# Patient Record
Sex: Female | Born: 1960 | Race: White | Hispanic: No | Marital: Married | Smoking: Current every day smoker
Health system: Southern US, Community
[De-identification: ages and names within clinical notes are randomized; demographics above are authoritative.]

## PROBLEM LIST (undated history)

## (undated) DIAGNOSIS — D519 Vitamin B12 deficiency anemia, unspecified: Secondary | ICD-10-CM

## (undated) DIAGNOSIS — E785 Hyperlipidemia, unspecified: Secondary | ICD-10-CM

## (undated) DIAGNOSIS — G459 Transient cerebral ischemic attack, unspecified: Secondary | ICD-10-CM

## (undated) DIAGNOSIS — I10 Essential (primary) hypertension: Secondary | ICD-10-CM

## (undated) DIAGNOSIS — E119 Type 2 diabetes mellitus without complications: Secondary | ICD-10-CM

## (undated) DIAGNOSIS — D509 Iron deficiency anemia, unspecified: Secondary | ICD-10-CM

## (undated) HISTORY — DX: Essential (primary) hypertension: I10

## (undated) HISTORY — PX: ORIF ELBOW FRACTURE: SUR928

## (undated) HISTORY — PX: FRACTURE SURGERY: SHX138

## (undated) HISTORY — DX: Hyperlipidemia, unspecified: E78.5

---

## 1993-12-29 HISTORY — PX: TUBAL LIGATION: SHX77

## 1996-12-29 HISTORY — PX: CARDIAC CATHETERIZATION: SHX172

## 2008-08-30 ENCOUNTER — Ambulatory Visit: Payer: Self-pay | Admitting: Cardiology

## 2008-08-30 ENCOUNTER — Encounter: Payer: Self-pay | Admitting: Cardiovascular Disease

## 2008-08-30 ENCOUNTER — Inpatient Hospital Stay (HOSPITAL_COMMUNITY): Admission: EM | Admit: 2008-08-30 | Discharge: 2008-08-31 | Payer: Self-pay | Admitting: Emergency Medicine

## 2008-08-31 ENCOUNTER — Ambulatory Visit: Payer: Self-pay | Admitting: Vascular Surgery

## 2008-08-31 ENCOUNTER — Encounter: Payer: Self-pay | Admitting: Cardiovascular Disease

## 2008-09-06 ENCOUNTER — Ambulatory Visit: Payer: Self-pay | Admitting: Internal Medicine

## 2008-09-06 DIAGNOSIS — R5381 Other malaise: Secondary | ICD-10-CM

## 2008-09-06 DIAGNOSIS — E1165 Type 2 diabetes mellitus with hyperglycemia: Secondary | ICD-10-CM

## 2008-09-06 DIAGNOSIS — E785 Hyperlipidemia, unspecified: Secondary | ICD-10-CM | POA: Insufficient documentation

## 2008-09-06 DIAGNOSIS — R5383 Other fatigue: Secondary | ICD-10-CM

## 2008-09-06 DIAGNOSIS — R030 Elevated blood-pressure reading, without diagnosis of hypertension: Secondary | ICD-10-CM

## 2008-09-06 DIAGNOSIS — D500 Iron deficiency anemia secondary to blood loss (chronic): Secondary | ICD-10-CM

## 2008-09-06 DIAGNOSIS — D509 Iron deficiency anemia, unspecified: Secondary | ICD-10-CM | POA: Insufficient documentation

## 2008-09-06 DIAGNOSIS — F17200 Nicotine dependence, unspecified, uncomplicated: Secondary | ICD-10-CM | POA: Insufficient documentation

## 2008-09-06 DIAGNOSIS — N920 Excessive and frequent menstruation with regular cycle: Secondary | ICD-10-CM

## 2008-09-06 DIAGNOSIS — E118 Type 2 diabetes mellitus with unspecified complications: Secondary | ICD-10-CM

## 2008-09-06 LAB — CONVERTED CEMR LAB
Basophils Absolute: 0.1 10*3/uL (ref 0.0–0.1)
Basophils Relative: 0.8 % (ref 0.0–3.0)
Eosinophils Absolute: 0.2 10*3/uL (ref 0.0–0.7)
HCT: 28.4 % — ABNORMAL LOW (ref 36.0–46.0)
Hemoglobin: 9 g/dL — ABNORMAL LOW (ref 12.0–15.0)
Lymphocytes Relative: 29.2 % (ref 12.0–46.0)
MCHC: 31.5 g/dL (ref 30.0–36.0)
MCV: 69.3 fL — ABNORMAL LOW (ref 78.0–100.0)
Monocytes Absolute: 0.3 10*3/uL (ref 0.1–1.0)
Neutro Abs: 5.3 10*3/uL (ref 1.4–7.7)
RBC: 4.1 M/uL (ref 3.87–5.11)
RDW: 16.5 % — ABNORMAL HIGH (ref 11.5–14.6)
Vitamin B-12: 164 pg/mL — ABNORMAL LOW (ref 211–911)

## 2008-09-07 ENCOUNTER — Telehealth: Payer: Self-pay | Admitting: Internal Medicine

## 2008-09-07 ENCOUNTER — Encounter: Payer: Self-pay | Admitting: Internal Medicine

## 2008-09-13 ENCOUNTER — Ambulatory Visit: Payer: Self-pay | Admitting: Internal Medicine

## 2008-09-13 ENCOUNTER — Telehealth: Payer: Self-pay | Admitting: Internal Medicine

## 2008-09-20 ENCOUNTER — Ambulatory Visit: Payer: Self-pay | Admitting: Internal Medicine

## 2008-09-20 DIAGNOSIS — D518 Other vitamin B12 deficiency anemias: Secondary | ICD-10-CM

## 2008-09-29 ENCOUNTER — Ambulatory Visit: Payer: Self-pay | Admitting: Internal Medicine

## 2008-10-10 ENCOUNTER — Ambulatory Visit: Payer: Self-pay | Admitting: Internal Medicine

## 2008-11-03 ENCOUNTER — Ambulatory Visit: Payer: Self-pay | Admitting: Internal Medicine

## 2008-11-22 ENCOUNTER — Ambulatory Visit: Payer: Self-pay | Admitting: Internal Medicine

## 2008-12-21 ENCOUNTER — Ambulatory Visit: Payer: Self-pay | Admitting: Internal Medicine

## 2009-02-20 ENCOUNTER — Ambulatory Visit: Payer: Self-pay | Admitting: Internal Medicine

## 2009-03-05 ENCOUNTER — Telehealth: Payer: Self-pay | Admitting: Internal Medicine

## 2009-04-13 ENCOUNTER — Telehealth: Payer: Self-pay | Admitting: Internal Medicine

## 2009-04-13 ENCOUNTER — Ambulatory Visit: Payer: Self-pay | Admitting: Internal Medicine

## 2010-01-22 ENCOUNTER — Telehealth: Payer: Self-pay | Admitting: Internal Medicine

## 2011-01-30 NOTE — Progress Notes (Signed)
Summary: refill  Phone Note Refill Request Message from:  Fax from Pharmacy  Refills Requested: Medication #1:  COBAL-1000 1000 MCG/ML SOLN one mL subcutaneous q.2 weeks Initial call taken by: Rock Nephew CMA,  January 22, 2010 1:50 PM    Prescriptions: COBAL-1000 1000 MCG/ML SOLN (CYANOCOBALAMIN) one mL subcutaneous q.2 weeks  #10 mL x 5   Entered by:   Rock Nephew CMA   Authorized by:   Etta Grandchild MD   Signed by:   Rock Nephew CMA on 01/22/2010   Method used:   Electronically to        [facility] [address] [phone]* (retail)       [address]       [place], [place]  [address]       Ph: [phone]       Fax: [phone]   RxID:   [id number] COBAL-1000 1000 MCG/ML SOLN (CYANOCOBALAMIN) one mL subcutaneous q.2 weeks  #10 mL x 5   Entered by:   Rock Nephew CMA   Authorized by:   Etta Grandchild MD   Signed by:   Rock Nephew CMA on 01/22/2010   Method used:   Electronically to        [facility] [address] [phone]* (retail)       [address]       [place], [place]  [address]       Ph: [phone] or [phone]       Fax: [phone]   RxID:   [id number]

## 2011-05-13 NOTE — H&P (Signed)
NAMEPRAIRIE, STENBERG                ACCOUNT NO.:  [id number]   MEDICAL RECORD NO.:  [id number]          PATIENT TYPE:  INP   LOCATION:  2623                         FACILITY:  [facility]   PHYSICIAN:  Veverly Fells. Excell Seltzer, MD  DATE OF BIRTH:  09-27-1961   DATE OF ADMISSION:  08/30/2008  DATE OF DISCHARGE:                              HISTORY & PHYSICAL   PRIMARY CARE PHYSICIAN:  None.   CARDIOLOGIST:  None.   CHIEF COMPLAINT:  Chest pain.   HISTORY OF PRESENT ILLNESS:  Ms. Gilkey is a 50 year old female with no  previous history of coronary artery disease.  In 1998, she had chest  pain and came to the emergency room where her EKG was abnormal.  She was  cathed and she reports the cath was okay.   She has had what she describes as a traveling pain.  She states she will  get pain in her left lower extremity that it will move over to her right  lower extremity, into her abdomen, and up into her chest.  She states  that the pain will be sharp and when she tries to go from a lying to a  sitting position, sometimes the pain is severe and radiates up to the  back of her head such that she has trouble sitting up.   Ms. Salah has had some sharp chest pain intermittently over the last  couple of days.  Today, she states she woke up about 7 a.m. and noted a  chest tightness.  It is under her left breast.  It reaches a 6/10.  She  has also had nausea for the last several days, but feels that the nausea  was worse today.  She denies diaphoresis or shortness of breath at rest,  although she has had increased dyspnea on exertion recently.  She went  to [facility] on [address] where her EKG was abnormal.  She was  transferred by EMS to [facility].  Initially, her EKG was  abnormal enough such that a code STEMI was called; however, upon further  review, her EKG is relatively unchanged from the one in 1998, so the  code STEMI is deferred for now and medical therapy is recommended.  She  has  been started on heparin and will receive additional sublingual  nitroglycerin.  She had baby aspirin x4 by EMS and sublingual  nitroglycerin, which did improve her symptoms.  Currently, she appears a  little anxious, but only in mild distress.   PAST MEDICAL HISTORY:  1. Status post cardiac catheterization in 1998, which was okay per the      patient.  2. Obesity.  3. She has had no checkup in 10 years, but has never been told that      she is hypertensive, has diabetes, or has high cholesterol.  4. Ongoing tobacco use.   SURGERIES:  C-section x3.   ALLERGIES:  SULFA.   CURRENT MEDICATIONS:  None.   SOCIAL HISTORY:  She lives in [place] with her husband and works as a  Naval architect.  She has a greater  than 30-pack-year history of ongoing  tobacco use.  She has approximately 2 drinks a day on the weekend, but  her husband states she does not drink during the week.  She denies drug  use.   FAMILY HISTORY:  Her mother is alive in her 57s and her father died in  his 24s.  Neither one had heart disease.  She has one sister with no  known history of CAD.   REVIEW OF SYSTEMS:  She has not had fevers, chills, or sweats.  She has  an occasional cough and rare wheezing.  She denies syncope or  presyncope.  She has had the chest pain, which is described above and  upon arrival to the emergency room, it was a 4/10.  She has chronic  arthralgias and especially lower extremity pain.  She denies reflux  symptoms, hematemesis, hemoptysis, or melena.  Full 14-point review of  systems is otherwise negative.   PHYSICAL EXAM:  VITAL SIGNS:  Her temperature is 98.3, blood pressure  153/92, pulse 66, respiratory rate 20, and O2 saturation 99% on room  air.  GENERAL:  She is a well-developed, well-nourished, white female in mild-  to-moderate distress.  HEENT:  Normal.  NECK:  There is no lymphadenopathy, thyromegaly, bruit, or JVD noted.  CV:  Her heart is regular in rate and rhythm with an S1  and S2 and no  significant murmur, rub, or gallop is noted.  LUNGS:  Essentially clear to auscultation bilaterally.  SKIN:  No rashes or lesions are noted.  ABDOMEN:  Soft and nontender with active bowel sounds.  No splenomegaly  is noted.  EXTREMITIES:  Distal pulses are 2 to 3 + in all four extremities and no  femoral bruits are appreciated.  There is no cyanosis, clubbing, or  edema noted.  MUSCULOSKELETAL:  There is no joint deformity or effusions and no spine  or CVA tenderness.  NEURO:  She is alert and oriented.  Cranial nerves II through XII are  grossly intact.   Chest x-ray:  No acute disease.   EKG:  Sinus rhythm, rate 64 with occasional PVCs.  She has diffuse  inferior ST elevation of 1 mm or less, which is unchanged from an EKG  dated December 1998.   LABORATORY VALUES:  Hemoglobin 9.3, hematocrit 30.5, WBC 5.3, platelets  191.  INR 1.1, PTT 26.  I-STAT shows a sodium of 140, potassium of 3.8,  chloride 107, BUN 9, creatinine 0.9, glucose 97.  Initial point of care  markers are negative.   IMPRESSION:  Ms. Moritz was seen today by Dr. Excell Seltzer.  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  She also has intermittent chest pain over  the last 2 days.  Her EKG shows 1-mm of inferior ST elevation without  reciprocal changes.  This EKG is unchanged from a previous tracing dated  1998.  Her exam is benign.  Dr. Excell Seltzer does not suspect acute myocardial  infarction.  We will check venous duplex to rule out deep venous  thrombosis and CT angiogram of the chest to rule out pulmonary embolism.  We will check a stat echocardiogram to assess her inferior  wall.  She will be treated with aspirin, heparin, nitroglycerin and a  beta-blocker as well as an empiric statin.  Lipid profile, TSH, and  other labs are pending at the time of dictation.  Further evaluation and  treatment will depend on the results of the above testing with the  decision on cardiac catheterization made once  the above data are  reviewed.      Theodore Demark, PA-C      Veverly Fells. Excell Seltzer, MD  Electronically Signed    RB/MEDQ  D:  08/30/2008  T:  08/31/2008  Job:  [id number]

## 2011-05-13 NOTE — Discharge Summary (Signed)
NAMESANIYA, TRANCHINA                ACCOUNT NO.:  [id number]   MEDICAL RECORD NO.:  [id number]          PATIENT TYPE:  INP   LOCATION:  2623                         FACILITY:  [facility]   PHYSICIAN:  Veverly Fells. Excell Seltzer, MD  DATE OF BIRTH:  19-Feb-1961   DATE OF ADMISSION:  08/30/2008  DATE OF DISCHARGE:  08/31/2008                               DISCHARGE SUMMARY   PRIMARY CARDIOLOGIST:  Veverly Fells. Excell Seltzer, MD.   PRIMARY CARE PHYSICIAN:  The patient does not have one but will be  established with Dr. Sanda Linger.   PROCEDURES PERFORMED DURING HOSPITALIZATION:  1. CT scan of chest.  2. [place] extremity Doppler studies to rule out DVT, both of which were      negative.   FINAL DISCHARGE DIAGNOSES:  1. Left calf pain.      a.     Status post lower extremity Doppler study, negative for deep       vein thrombosis.  2. Chest pain.      a.     Status post CT scan of the chest, pulmonary embolism ruled       out with negative cardiac enzymes.  3. Hypercholesterolemia.  4. Vasovagal response to blood draws causing bradycardia and fainting.  5. Anemia.      a.     History of heavy menses and microcytic anemia.   HOSPITAL COURSE:  This is a 50 year old obese Caucasian female with no  prior history of coronary artery disease who had a chest pain and came  to the emergency room and pain in her lower extremities.  She described  it as traveling pain.  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  The pain is described as sharp over the last couple  of days, and she states that she awoke with chest tightness around 7:00  a.m. the day of admission, located under her left breast which was at an  intensity of 6/10.  The patient also had some mild nausea.  As a result  of this, the patient came to [facility] Emergency Room where she was  seen and examined by Dr. Tonny Bollman to evaluate for coronary artery  disease  and rule out myocardial infarction.   The patient was admitted, placed on a nitroglycerin drip and heparin  drip, and cardiac enzymes were cycled.  There were found to be negative  x3.  Echocardiogram was also completed revealing LVEF of 60%.  The  patient's chest CT to evaluate for PE was negative, and the patient had  followup lower extremity Doppler studies which were negative.   It was noted that the patient was anemic during hospitalization with a  hemoglobin of 8.4 and hematocrit of 26.7.  The patient's white blood  cells were 6.9 with platelets of 168.  The patient has a history of  microcytic anemia and has not been followed by primary care or OB/GYN in  approximately 12 years.  The patient has a history of heavy menses and  anemia that has not had any follow up concerning this.  As a result of  this, the patient will be established with primary care and reestablish  with her OB/GYN physician prior to discharge.   On day of discharge, the patient was seen and examined by myself and Dr.  Tonny Bollman and was found to be stable.  All testing back negative.  We believe this may be more musculoskeletal pain in origin.  However,  the patient will need to be to established with primary care physician.  We have made arrangements for the patient to follow up with Dr. Shelah Lewandowsky with [facility] and reestablish with Dr. Richarda Overlie for OB/GYN physician.   DISCHARGE LABORATORY DATA:  Sodium 141, potassium 3.6, chloride 108, CO2  of 25, BUN 7, creatinine 1.0, and glucose 109.  Hemoglobin 8.4,  hematocrit 26.7, white blood cells 6.9, and platelets 168.  Total  cholesterol 212, triglycerides 293, HDL 36, and LDL 117.  Hemoglobin A1c  has been drawn but results are pending.   DISCHARGE MEDICATIONS:  Crestor 40 mg p.o. at bedtime.   ALLERGIES:  SULFA.   FOLLOWUP PLANS AND APPOINTMENTS:  1. The patient will be established with Dr. Sanda Linger with [facility]       Primary Care on September 06, 2008, at 2:45 p.m.  2. The patient will be reestablished with OB/GYN physician, Dr.      Richarda Overlie with an appointment scheduled for September 13, 2008, at 11:00 a.m.  3. The patient has been given prescription for Crestor to take at home      at 40 mg at bedtime.   Time spent with the patient to include physician time 35 minutes.      Bettey Mare. Lyman Bishop, NP      Veverly Fells. Excell Seltzer, MD  Electronically Signed    KML/MEDQ  D:  08/31/2008  T:  09/01/2008  Job:  [id number]   cc:   Duke Salvia. Marcelle Overlie, M.D.  Sanda Linger, MD

## 2011-08-20 LAB — HM PAP SMEAR: HM Pap smear: NORMAL

## 2011-10-01 LAB — COMPREHENSIVE METABOLIC PANEL
ALT: 14
AST: 18
Alkaline Phosphatase: 53
CO2: 23
Calcium: 8.4
GFR calc Af Amer: >60
Glucose, Bld: 104 — ABNORMAL HIGH
Potassium: 3.7
Sodium: 139
Total Protein: 6.3

## 2011-10-01 LAB — RAPID URINE DRUG SCREEN, HOSP PERFORMED: Tetrahydrocannabinol: NOT DETECTED

## 2011-10-01 LAB — CBC
HCT: 30.5 — ABNORMAL LOW
Hemoglobin: 8.4 — ABNORMAL LOW
Hemoglobin: 9.3 — ABNORMAL LOW
MCHC: 30.6
MCV: 69 — ABNORMAL LOW
MCV: 69.7 — ABNORMAL LOW
RBC: 3.87
RBC: 4.38
WBC: 5.3
WBC: 6.9

## 2011-10-01 LAB — CK TOTAL AND CKMB (NOT AT ARMC)
CK, MB: 1.2
Relative Index: INVALID
Total CK: 63

## 2011-10-01 LAB — POCT I-STAT, CHEM 8
HCT: 31 — ABNORMAL LOW
Hemoglobin: 10.5 — ABNORMAL LOW
Potassium: 3.8
Sodium: 140
TCO2: 24

## 2011-10-01 LAB — HEPARIN LEVEL (UNFRACTIONATED): Heparin Unfractionated: 0.48

## 2011-10-01 LAB — BASIC METABOLIC PANEL
BUN: 7
CO2: 26
Chloride: 108
Creatinine, Ser: 1.01
GFR calc Af Amer: >60
Glucose, Bld: 109 — ABNORMAL HIGH

## 2011-10-01 LAB — CROSSMATCH: Antibody Screen: NEGATIVE

## 2011-10-01 LAB — DIFFERENTIAL
Basophils Relative: 1
Eosinophils Absolute: 0.2
Eosinophils Relative: 3
Monocytes Absolute: 0.3
Neutro Abs: 3.1
Neutrophils Relative %: 60

## 2011-10-01 LAB — HEPARIN ANTIBODY SCREEN: Heparin Antibody Screen: POSITIVE

## 2011-10-01 LAB — LIPID PANEL
HDL: 36 — ABNORMAL LOW
Triglycerides: 293 — ABNORMAL HIGH
VLDL: 59 — ABNORMAL HIGH

## 2011-10-01 LAB — POCT CARDIAC MARKERS
CKMB, poc: 1.1
Myoglobin, poc: 40.8
Troponin i, poc: <0.05

## 2011-10-01 LAB — PROTIME-INR: Prothrombin Time: 14.2

## 2011-10-01 LAB — CARDIAC PANEL(CRET KIN+CKTOT+MB+TROPI)
CK, MB: 1
Relative Index: INVALID
Total CK: 62

## 2011-10-01 LAB — TROPONIN I: Troponin I: <0.01

## 2011-10-01 LAB — HEPARIN INDUCED THROMBOCYTOPENIA PNL: Patient O.D.: 0.242

## 2012-01-08 ENCOUNTER — Other Ambulatory Visit: Payer: Self-pay | Admitting: Internal Medicine

## 2012-01-20 ENCOUNTER — Other Ambulatory Visit: Payer: Self-pay

## 2012-01-20 NOTE — Telephone Encounter (Signed)
Patient requesting refill on Vitamin B12 for her injections. [facility] is [facility]. Please call the patient when done at [phone]

## 2012-01-21 NOTE — Telephone Encounter (Signed)
Denied, pharmacy notified. Patient no longer under MD care, has not been seen since 2010

## 2012-06-11 ENCOUNTER — Ambulatory Visit (INDEPENDENT_AMBULATORY_CARE_PROVIDER_SITE_OTHER): Payer: Managed Care, Other (non HMO) | Admitting: Internal Medicine

## 2012-06-11 ENCOUNTER — Other Ambulatory Visit (INDEPENDENT_AMBULATORY_CARE_PROVIDER_SITE_OTHER): Payer: Managed Care, Other (non HMO)

## 2012-06-11 ENCOUNTER — Telehealth: Payer: Self-pay | Admitting: *Deleted

## 2012-06-11 ENCOUNTER — Encounter: Payer: Self-pay | Admitting: Internal Medicine

## 2012-06-11 VITALS — BP 168/100 | HR 72 | Temp 98.4°F | Resp 16 | Wt 185.8 lb

## 2012-06-11 DIAGNOSIS — E785 Hyperlipidemia, unspecified: Secondary | ICD-10-CM

## 2012-06-11 DIAGNOSIS — D529 Folate deficiency anemia, unspecified: Secondary | ICD-10-CM

## 2012-06-11 DIAGNOSIS — D518 Other vitamin B12 deficiency anemias: Secondary | ICD-10-CM

## 2012-06-11 DIAGNOSIS — Z1231 Encounter for screening mammogram for malignant neoplasm of breast: Secondary | ICD-10-CM | POA: Insufficient documentation

## 2012-06-11 DIAGNOSIS — M5416 Radiculopathy, lumbar region: Secondary | ICD-10-CM | POA: Insufficient documentation

## 2012-06-11 DIAGNOSIS — I10 Essential (primary) hypertension: Secondary | ICD-10-CM | POA: Insufficient documentation

## 2012-06-11 DIAGNOSIS — D509 Iron deficiency anemia, unspecified: Secondary | ICD-10-CM

## 2012-06-11 DIAGNOSIS — E119 Type 2 diabetes mellitus without complications: Secondary | ICD-10-CM

## 2012-06-11 DIAGNOSIS — IMO0002 Reserved for concepts with insufficient information to code with codable children: Secondary | ICD-10-CM

## 2012-06-11 DIAGNOSIS — D500 Iron deficiency anemia secondary to blood loss (chronic): Secondary | ICD-10-CM

## 2012-06-11 LAB — URINALYSIS, ROUTINE W REFLEX MICROSCOPIC
Bilirubin Urine: NEGATIVE
Hgb urine dipstick: NEGATIVE
Ketones, ur: NEGATIVE
Urobilinogen, UA: 0.2 (ref 0.0–1.0)

## 2012-06-11 LAB — LIPID PANEL
HDL: 43.6 mg/dL (ref >39.00)
LDL Cholesterol: 121 mg/dL — ABNORMAL HIGH (ref 0–99)
Total CHOL/HDL Ratio: 4
Triglycerides: 159 mg/dL — ABNORMAL HIGH (ref 0.0–149.0)

## 2012-06-11 LAB — CBC WITH DIFFERENTIAL/PLATELET
Eosinophils Relative: 2.7 % (ref 0.0–5.0)
Lymphocytes Relative: 33.8 % (ref 12.0–46.0)
MCV: 59.8 fl — ABNORMAL LOW (ref 78.0–100.0)
Monocytes Absolute: 0.4 10*3/uL (ref 0.1–1.0)
Neutrophils Relative %: 55.9 % (ref 43.0–77.0)
Platelets: 230 10*3/uL (ref 150.0–400.0)
RBC: 3.91 Mil/uL (ref 3.87–5.11)
WBC: 6.9 10*3/uL (ref 4.5–10.5)

## 2012-06-11 LAB — COMPREHENSIVE METABOLIC PANEL
AST: 20 U/L (ref 0–37)
Albumin: 3.8 g/dL (ref 3.5–5.2)
Alkaline Phosphatase: 59 U/L (ref 39–117)
Calcium: 8.6 mg/dL (ref 8.4–10.5)
Chloride: 105 mEq/L (ref 96–112)
Glucose, Bld: 112 mg/dL — ABNORMAL HIGH (ref 70–99)
Potassium: 4.1 mEq/L (ref 3.5–5.1)
Sodium: 138 mEq/L (ref 135–145)
Total Protein: 7.2 g/dL (ref 6.0–8.3)

## 2012-06-11 LAB — VITAMIN B12: Vitamin B-12: 839 pg/mL (ref 211–911)

## 2012-06-11 LAB — FERRITIN: Ferritin: 1.5 ng/mL — ABNORMAL LOW (ref 10.0–291.0)

## 2012-06-11 MED ORDER — "SYRINGE/NEEDLE (DISP) 25G X 5/8"" 3 ML MISC": 1.0000 | Status: DC

## 2012-06-11 MED ORDER — AMLODIPINE-OLMESARTAN 10-40 MG PO TABS: 1.0000 | ORAL_TABLET | Freq: Every day | ORAL | Status: DC

## 2012-06-11 MED ORDER — CYANOCOBALAMIN 1000 MCG/ML IJ SOLN: 1000.0000 ug | INTRAMUSCULAR | Status: DC

## 2012-06-11 NOTE — Telephone Encounter (Signed)
CRITICAL LAB VALUE CALLED FROM [facility] IN [facility].  PATIENT HGB 6.8; HCT 23.4

## 2012-06-11 NOTE — Telephone Encounter (Signed)
CRITICAL LAB VALUE SENT TO DR. Leitha Schuller

## 2012-06-11 NOTE — Patient Instructions (Signed)
Hypertension As your heart beats, it forces blood through your arteries. This force is your blood pressure. If the pressure is too high, it is called hypertension (HTN) or high blood pressure. HTN is dangerous because you may have it and not know it. High blood pressure may mean that your heart has to work harder to pump blood. Your arteries may be narrow or stiff. The extra work puts you at risk for heart disease, stroke, and other problems.  Blood pressure consists of two numbers, a higher number over a lower, 110/72, for example. It is stated as "110 over 72." The ideal is below 120 for the top number (systolic) and under 80 for the bottom (diastolic). Write down your blood pressure today. You should pay close attention to your blood pressure if you have certain conditions such as:  Heart failure.   Prior heart attack.   Diabetes   Chronic kidney disease.   Prior stroke.   Multiple risk factors for heart disease.  To see if you have HTN, your blood pressure should be measured while you are seated with your arm held at the level of the heart. It should be measured at least twice. A one-time elevated blood pressure reading (especially in the Emergency Department) does not mean that you need treatment. There may be conditions in which the blood pressure is different between your right and left arms. It is important to see your caregiver soon for a recheck. Most people have essential hypertension which means that there is not a specific cause. This type of high blood pressure may be lowered by changing lifestyle factors such as:  Stress.   Smoking.   Lack of exercise.   Excessive weight.   Drug/tobacco/alcohol use.   Eating less salt.  Most people do not have symptoms from high blood pressure until it has caused damage to the body. Effective treatment can often prevent, delay or reduce that damage. TREATMENT  When a cause has been identified, treatment for high blood pressure is  directed at the cause. There are a large number of medications to treat HTN. These fall into several categories, and your caregiver will help you select the medicines that are best for you. Medications may have side effects. You should review side effects with your caregiver. If your blood pressure stays high after you have made lifestyle changes or started on medicines,   Your medication(s) may need to be changed.   Other problems may need to be addressed.   Be certain you understand your prescriptions, and know how and when to take your medicine.   Be sure to follow up with your caregiver within the time frame advised (usually within two weeks) to have your blood pressure rechecked and to review your medications.   If you are taking more than one medicine to lower your blood pressure, make sure you know how and at what times they should be taken. Taking two medicines at the same time can result in blood pressure that is too low.  SEEK IMMEDIATE MEDICAL CARE IF:  You develop a severe headache, blurred or changing vision, or confusion.   You have unusual weakness or numbness, or a faint feeling.   You have severe chest or abdominal pain, vomiting, or breathing problems.  MAKE SURE YOU:   Understand these instructions.   Will watch your condition.   Will get help right away if you are not doing well or get worse.  Document Released: 12/15/2005 Document Revised: 12/04/2011 Document Reviewed:   08/04/2008 Exit[employer] Patient Information 2012 [employer].Back Pain, Adult Low back pain is very common. About 1 in 5 people have back pain.The cause of low back pain is rarely dangerous. The pain often gets better over time.About half of people with a sudden onset of back pain feel better in just 2 weeks. About 8 in 10 people feel better by 6 weeks.  CAUSES Some common causes of back pain include:  Strain of the muscles or ligaments supporting the spine.   Wear and tear (degeneration) of the  spinal discs.   Arthritis.   Direct injury to the back.  DIAGNOSIS Most of the time, the direct cause of low back pain is not known.However, back pain can be treated effectively even when the exact cause of the pain is unknown.Answering your caregiver's questions about your overall health and symptoms is one of the most accurate ways to make sure the cause of your pain is not dangerous. If your caregiver needs more information, he or she may order lab work or imaging tests (X-rays or MRIs).However, even if imaging tests show changes in your back, this usually does not require surgery. HOME CARE INSTRUCTIONS For many people, back pain returns.Since low back pain is rarely dangerous, it is often a condition that people can learn to manageon their own.   Remain active. It is stressful on the back to sit or stand in one place. Do not sit, drive, or stand in one place for more than 30 minutes at a time. Take short walks on level surfaces as soon as pain allows.Try to increase the length of time you walk each day.   Do not stay in bed.Resting more than 1 or 2 days can delay your recovery.   Do not avoid exercise or work.Your body is made to move.It is not dangerous to be active, even though your back may hurt.Your back will likely heal faster if you return to being active before your pain is gone.   Pay attention to your body when you bend and lift. Many people have less discomfortwhen lifting if they bend their knees, keep the load close to their bodies,and avoid twisting. Often, the most comfortable positions are those that put less stress on your recovering back.   Find a comfortable position to sleep. Use a firm mattress and lie on your side with your knees slightly bent. If you lie on your back, put a pillow under your knees.   Only take over-the-counter or prescription medicines as directed by your caregiver. Over-the-counter medicines to reduce pain and inflammation are often the  most helpful.Your caregiver may prescribe muscle relaxant drugs.These medicines help dull your pain so you can more quickly return to your normal activities and healthy exercise.   Put ice on the injured area.   Put ice in a plastic bag.   Place a towel between your skin and the bag.   Leave the ice on for 15 to 20 minutes, 3 to 4 times a day for the first 2 to 3 days. After that, ice and heat may be alternated to reduce pain and spasms.   Ask your caregiver about trying back exercises and gentle massage. This may be of some benefit.   Avoid feeling anxious or stressed.Stress increases muscle tension and can worsen back pain.It is important to recognize when you are anxious or stressed and learn ways to manage it.Exercise is a great option.  SEEK MEDICAL CARE IF:  You have pain that is not relieved with   rest or medicine.   You have pain that does not improve in 1 week.   You have new symptoms.   You are generally not feeling well.  SEEK IMMEDIATE MEDICAL CARE IF:   You have pain that radiates from your back into your legs.   You develop new bowel or bladder control problems.   You have unusual weakness or numbness in your arms or legs.   You develop nausea or vomiting.   You develop abdominal pain.   You feel faint.  Document Released: 12/15/2005 Document Revised: 12/04/2011 Document Reviewed: 05/05/2011 [employer] Patient Information 2012 [employer]. 

## 2012-06-11 NOTE — Progress Notes (Signed)
Subjective:    Patient ID: Madeline West, female    DOB: [birth year], 51 y.o.   MRN: [id number]  Back Pain This is a chronic problem. Episode onset: 3 months ago. The problem has been gradually worsening since onset. The pain is present in the lumbar spine. The quality of the pain is described as shooting and stabbing. The pain radiates to the right thigh. The pain is at a severity of 6/10. The pain is moderate. The pain is worse during the day. The symptoms are aggravated by twisting. Stiffness is present all day. Associated symptoms include leg pain, numbness (right leg) and tingling (right leg). Pertinent negatives include no abdominal pain, bladder incontinence, bowel incontinence, chest pain, dysuria, fever, headaches, paresis, paresthesias, pelvic pain, perianal numbness, weakness or weight loss. Risk factors include obesity and lack of exercise. She has tried NSAIDs for the symptoms. The treatment provided moderate relief.  Anemia Presents for follow-up visit. Symptoms include light-headedness, malaise/fatigue and pallor. There has been no abdominal pain, anorexia, bruising/bleeding easily, confusion, fever, leg swelling, palpitations, paresthesias, pica or weight loss. Signs of blood loss that are present include menorrhagia. Signs of blood loss that are not present include hematemesis, hematochezia, melena and vaginal bleeding. Compliance problems include psychosocial issues.  Compliance with medications is 0-25%.      Review of Systems  Constitutional: Positive for malaise/fatigue and fatigue. Negative for fever, chills, weight loss, diaphoresis, activity change, appetite change and unexpected weight change.  HENT: Negative.   Eyes: Negative.   Respiratory: Negative for apnea, cough, choking, shortness of breath, wheezing and stridor.   Cardiovascular: Negative for chest pain, palpitations and leg swelling.  Gastrointestinal: Negative for nausea, vomiting, abdominal pain, diarrhea,  constipation, blood in stool, melena, hematochezia, abdominal distention, anal bleeding, rectal pain, anorexia, hematemesis and bowel incontinence.  Genitourinary: Positive for menorrhagia. Negative for bladder incontinence, dysuria, urgency, frequency, hematuria, flank pain, decreased urine volume, vaginal bleeding, vaginal discharge, enuresis, difficulty urinating, genital sores, vaginal pain, menstrual problem, pelvic pain and dyspareunia.  Musculoskeletal: Positive for back pain. Negative for myalgias, joint swelling, arthralgias and gait problem.  Skin: Positive for pallor. Negative for color change, rash and wound.  Neurological: Positive for tingling (right leg), light-headedness and numbness (right leg). Negative for dizziness, tremors, seizures, syncope, facial asymmetry, speech difficulty, weakness, headaches and paresthesias.  Hematological: Negative for adenopathy. Does not bruise/bleed easily.  Psychiatric/Behavioral: Negative.  Negative for confusion.       Objective:   Physical Exam  Vitals reviewed. Constitutional: She is oriented to person, place, and time. She appears well-developed and well-nourished. No distress.  HENT:  Head: Normocephalic and atraumatic. No trismus in the jaw.  Mouth/Throat: Oropharynx is clear and moist. Mucous membranes are pale, not dry and not cyanotic. No uvula swelling. No oropharyngeal exudate.  Eyes: Conjunctivae are normal. Right eye exhibits no discharge. Left eye exhibits no discharge. No scleral icterus.  Neck: Normal range of motion. Neck supple. No JVD present. No tracheal deviation present. No thyromegaly present.  Cardiovascular: Normal rate, regular rhythm, normal heart sounds and intact distal pulses.  Exam reveals no gallop and no friction rub.   No murmur heard. Pulmonary/Chest: Effort normal and breath sounds normal. No stridor. No respiratory distress. She has no wheezes. She has no rales. She exhibits no tenderness.  Abdominal: Soft.  Bowel sounds are normal. She exhibits no distension and no mass. There is no tenderness. There is no rebound and no guarding.  Musculoskeletal: Normal range of motion. She exhibits no  edema and no tenderness.       Lumbar back: Normal. She exhibits normal range of motion, no tenderness, no bony tenderness, no swelling, no edema, no deformity, no laceration, no pain and no spasm.       + SLR in the RLE - SLR in the LLE  Lymphadenopathy:    She has no cervical adenopathy.  Neurological: She is alert and oriented to person, place, and time. She has normal strength. She displays abnormal reflex. She displays no atrophy and no tremor. No cranial nerve deficit or sensory deficit. She exhibits normal muscle tone. She displays a negative Romberg sign. She displays no seizure activity. Coordination and gait normal. She displays no Babinski's sign on the right side. She displays no Babinski's sign on the left side.  Reflex Scores:      Tricep reflexes are 1+ on the right side and 1+ on the left side.      Bicep reflexes are 1+ on the right side and 1+ on the left side.      Brachioradialis reflexes are 1+ on the right side.      Patellar reflexes are 0 on the right side.      Achilles reflexes are 0 on the right side and 1+ on the left side. Skin: Skin is warm and dry. No rash noted. She is not diaphoretic. No erythema. No pallor.  Psychiatric: She has a normal mood and affect. Her behavior is normal. Judgment and thought content normal.     Lab Results  Component Value Date   WBC 8.3 09/06/2008   HGB 9.0* 09/06/2008   HCT 28.4* 09/06/2008   PLT 245 09/06/2008   GLUCOSE 109* 08/31/2008   CHOL  Value: 212        ATP III CLASSIFICATION:  <200     mg/dL   Desirable  914-782  mg/dL   Borderline High  >=956    mg/dL   High* 01/29/3085   TRIG 293* 08/30/2008   HDL 36* 08/30/2008   LDLCALC  Value: 117        Total Cholesterol/HDL:CHD Risk Coronary Heart Disease Risk Table                     Men   Women  1/2 Average  Risk   3.4   3.3* 08/30/2008   ALT 14 08/30/2008   AST 18 08/30/2008   NA 141 08/31/2008   K 3.6 08/31/2008   CL 108 08/31/2008   CREATININE 1.01 08/31/2008   BUN 7 08/31/2008   CO2 26 08/31/2008   TSH 3.09 09/06/2008   INR 1.1 08/30/2008   HGBA1C  Value: 6.3 (NOTE)   The ADA recommends the following therapeutic goal for glycemic   control related to Hgb A1C measurement:   Goal of Therapy:   < 7.0% Hgb A1C   Reference: American Diabetes Association: Clinical Practice   Recommendations 2008, Diabetes Care,  2008, 31:(Suppl 1).* 08/31/2008       Assessment & Plan:

## 2012-06-12 ENCOUNTER — Encounter: Payer: Self-pay | Admitting: Internal Medicine

## 2012-06-12 DIAGNOSIS — D529 Folate deficiency anemia, unspecified: Secondary | ICD-10-CM | POA: Insufficient documentation

## 2012-06-12 LAB — HM DIABETES FOOT EXAM: HM Diabetic Foot Exam: NORMAL

## 2012-06-12 MED ORDER — FERROUS SULFATE 325 (65 FE) MG PO TABS: 325.0000 mg | ORAL_TABLET | Freq: Three times a day (TID) | ORAL | Status: DC

## 2012-06-12 MED ORDER — FOLIC ACID 1 MG PO TABS: 1.0000 mg | ORAL_TABLET | Freq: Every day | ORAL | Status: AC

## 2012-06-12 NOTE — Addendum Note (Signed)
Addended by: Etta Grandchild on: 06/12/2012 12:13 PM   Modules accepted: Orders

## 2012-06-12 NOTE — Assessment & Plan Note (Signed)
Start folic acid replacement therapy

## 2012-06-12 NOTE — Assessment & Plan Note (Signed)
She has radiating Right LBP with abnormal neuro signs, she needs an MRI to look for spinal stenosis, HNP, mass, etc

## 2012-06-12 NOTE — Assessment & Plan Note (Signed)
Check CBC and B12 level today 

## 2012-06-12 NOTE — Assessment & Plan Note (Signed)
Restart iron tabs, check CBC and iron level today

## 2012-06-12 NOTE — Assessment & Plan Note (Signed)
FLP today 

## 2012-06-12 NOTE — Assessment & Plan Note (Signed)
I will check her a1c today 

## 2012-06-12 NOTE — Assessment & Plan Note (Signed)
Start Azor and check labs today to look for end organ damage and secondary causes

## 2012-06-12 NOTE — Assessment & Plan Note (Signed)
CBC today.  

## 2012-06-21 ENCOUNTER — Ambulatory Visit
Admission: RE | Admit: 2012-06-21 | Discharge: 2012-06-21 | Disposition: A | Discharge status: Home / Self Care | Payer: Managed Care, Other (non HMO) | Source: Ambulatory Visit | Attending: Internal Medicine | Admitting: Internal Medicine

## 2012-06-21 ENCOUNTER — Encounter: Payer: Self-pay | Admitting: Internal Medicine

## 2012-06-21 DIAGNOSIS — M5416 Radiculopathy, lumbar region: Secondary | ICD-10-CM

## 2012-06-25 ENCOUNTER — Telehealth: Payer: Self-pay

## 2012-06-25 MED ORDER — HYDROCODONE-ACETAMINOPHEN 5-500 MG PO TABS: 2.0000 | ORAL_TABLET | Freq: Four times a day (QID) | ORAL | Status: DC | PRN

## 2012-06-25 NOTE — Telephone Encounter (Signed)
Please call in the vicodin rx

## 2012-06-25 NOTE — Telephone Encounter (Signed)
RX called in .

## 2012-06-25 NOTE — Telephone Encounter (Signed)
Pt called requesting Rx for pain medication to treat back pain related to herniated disc per recent MRI. Please advise.

## 2012-07-06 ENCOUNTER — Other Ambulatory Visit: Payer: Self-pay

## 2012-07-06 DIAGNOSIS — D518 Other vitamin B12 deficiency anemias: Secondary | ICD-10-CM

## 2012-07-06 MED ORDER — CYANOCOBALAMIN 1000 MCG/ML IJ SOLN: 1000.0000 ug | INTRAMUSCULAR | Status: DC

## 2012-09-03 ENCOUNTER — Telehealth: Payer: Self-pay

## 2012-09-03 MED ORDER — HYDROCODONE-ACETAMINOPHEN 5-500 MG PO TABS: 2.0000 | ORAL_TABLET | Freq: Four times a day (QID) | ORAL | Status: AC | PRN

## 2012-09-03 NOTE — Telephone Encounter (Signed)
ok 

## 2012-09-03 NOTE — Telephone Encounter (Signed)
Please advise if ok to refill hydocodone [phone], pt last seen 06/11/12

## 2012-12-20 ENCOUNTER — Ambulatory Visit (INDEPENDENT_AMBULATORY_CARE_PROVIDER_SITE_OTHER): Payer: Managed Care, Other (non HMO) | Admitting: Internal Medicine

## 2012-12-20 ENCOUNTER — Encounter: Payer: Self-pay | Admitting: Internal Medicine

## 2012-12-20 VITALS — BP 150/88 | HR 78 | Temp 98.8°F | Resp 10 | Wt 188.0 lb

## 2012-12-20 DIAGNOSIS — M5416 Radiculopathy, lumbar region: Secondary | ICD-10-CM

## 2012-12-20 DIAGNOSIS — D518 Other vitamin B12 deficiency anemias: Secondary | ICD-10-CM

## 2012-12-20 DIAGNOSIS — I10 Essential (primary) hypertension: Secondary | ICD-10-CM

## 2012-12-20 DIAGNOSIS — D509 Iron deficiency anemia, unspecified: Secondary | ICD-10-CM

## 2012-12-20 DIAGNOSIS — E119 Type 2 diabetes mellitus without complications: Secondary | ICD-10-CM

## 2012-12-20 DIAGNOSIS — D529 Folate deficiency anemia, unspecified: Secondary | ICD-10-CM

## 2012-12-20 DIAGNOSIS — IMO0002 Reserved for concepts with insufficient information to code with codable children: Secondary | ICD-10-CM

## 2012-12-20 MED ORDER — PREGABALIN 50 MG PO CAPS: 50.0000 mg | ORAL_CAPSULE | Freq: Three times a day (TID) | ORAL | Status: DC

## 2012-12-20 MED ORDER — OLMESARTAN MEDOXOMIL-HCTZ 40-12.5 MG PO TABS
1.0000 | ORAL_TABLET | Freq: Every day | ORAL | Status: DC
Start: 2012-12-20 — End: 2014-03-27

## 2012-12-20 MED ORDER — OXYCODONE HCL 5 MG PO TABS: 5.0000 mg | ORAL_TABLET | ORAL | Status: DC | PRN

## 2012-12-20 NOTE — Assessment & Plan Note (Signed)
Repeat CBC and B12 level today

## 2012-12-20 NOTE — Progress Notes (Signed)
Subjective:    Patient ID: Madeline West, female    DOB: 08/31/1961, 51 y.o.   MRN: [id number]  Back Pain This is a recurrent problem. The current episode started more than 1 year ago. The problem occurs constantly. The problem has been gradually worsening since onset. The pain is present in the lumbar spine. The quality of the pain is described as burning and stabbing. The pain radiates to the right foot. The pain is at a severity of 6/10. The pain is moderate. The pain is worse during the day. The symptoms are aggravated by standing. Associated symptoms include paresthesias (tingling in her right leg) and tingling. Pertinent negatives include no abdominal pain, bladder incontinence, bowel incontinence, chest pain, dysuria, fever, headaches, leg pain, numbness, paresis, pelvic pain, perianal numbness, weakness or weight loss. Risk factors include obesity and lack of exercise. She has tried NSAIDs and analgesics for the symptoms. The treatment provided mild relief.      Review of Systems  Constitutional: Negative for fever, chills, weight loss, diaphoresis, activity change, appetite change, fatigue and unexpected weight change.  HENT: Negative.   Eyes: Negative.   Respiratory: Negative for cough, chest tightness, shortness of breath, wheezing and stridor.   Cardiovascular: Negative for chest pain, palpitations and leg swelling.  Gastrointestinal: Negative for nausea, vomiting, abdominal pain, diarrhea, constipation, blood in stool, anal bleeding and bowel incontinence.  Genitourinary: Negative.  Negative for bladder incontinence, dysuria and pelvic pain.  Musculoskeletal: Positive for back pain. Negative for myalgias, joint swelling, arthralgias and gait problem.  Skin: Negative for color change, pallor, rash and wound.  Neurological: Positive for tingling and paresthesias (tingling in her right leg). Negative for dizziness, speech difficulty, weakness, numbness and headaches.  Hematological:  Negative for adenopathy. Does not bruise/bleed easily.  Psychiatric/Behavioral: Negative.        Objective:   Physical Exam  Vitals reviewed. Constitutional: She is oriented to person, place, and time. She appears well-developed and well-nourished. No distress.  HENT:  Head: Normocephalic and atraumatic.  Mouth/Throat: Oropharynx is clear and moist. No oropharyngeal exudate.  Eyes: Conjunctivae normal are normal. Right eye exhibits no discharge. Left eye exhibits no discharge. No scleral icterus.  Neck: Normal range of motion. Neck supple. No JVD present. No tracheal deviation present. No thyromegaly present.  Cardiovascular: Normal rate, regular rhythm, normal heart sounds and intact distal pulses.  Exam reveals no gallop and no friction rub.   No murmur heard. Pulmonary/Chest: Effort normal and breath sounds normal. No stridor. No respiratory distress. She has no wheezes. She has no rales. She exhibits no tenderness.  Abdominal: Soft. Bowel sounds are normal. She exhibits no distension and no mass. There is no tenderness. There is no rebound and no guarding.  Musculoskeletal: Normal range of motion. She exhibits no edema and no tenderness.       Lumbar back: Normal. She exhibits normal range of motion, no tenderness, no bony tenderness, no swelling, no edema, no deformity, no laceration, no pain, no spasm and normal pulse.  Lymphadenopathy:    She has no cervical adenopathy.  Neurological: She is alert and oriented to person, place, and time. She has normal strength. She displays no atrophy, no tremor and normal reflexes. No cranial nerve deficit or sensory deficit. She exhibits normal muscle tone. She displays a negative Romberg sign. She displays no seizure activity. Coordination and gait normal.  Reflex Scores:      Tricep reflexes are 1+ on the right side and 1+ on the left  side.      Bicep reflexes are 1+ on the right side and 1+ on the left side.      Brachioradialis reflexes are 1+  on the right side and 1+ on the left side.      Patellar reflexes are 1+ on the right side and 1+ on the left side.      Achilles reflexes are 1+ on the right side and 1+ on the left side.      - SLR in BLE  Skin: Skin is warm and dry. No rash noted. She is not diaphoretic. No erythema. No pallor.  Psychiatric: She has a normal mood and affect. Her behavior is normal. Judgment and thought content normal.     Lab Results  Component Value Date   WBC 6.9 06/11/2012   HGB 6.8 Repeated and verified X2.* 06/11/2012   HCT 23.4 Repeated and verified X2.* 06/11/2012   PLT 230.0 06/11/2012   GLUCOSE 112* 06/11/2012   CHOL 196 06/11/2012   TRIG 159.0* 06/11/2012   HDL 43.60 06/11/2012   LDLCALC 121* 06/11/2012   ALT 13 06/11/2012   AST 20 06/11/2012   NA 138 06/11/2012   K 4.1 06/11/2012   CL 105 06/11/2012   CREATININE 0.9 06/11/2012   BUN 14 06/11/2012   CO2 27 06/11/2012   TSH 2.03 06/11/2012   INR 1.1 08/30/2008   HGBA1C 6.5 06/11/2012   Mr Lumbar Spine Wo Contrast  06/21/2012  *RADIOLOGY REPORT*  Clinical Data: Low back pain and right leg pain for 2-3 months.  MRI LUMBAR SPINE WITHOUT CONTRAST  Technique:  Multiplanar and multiecho pulse sequences of the lumbar spine were obtained without intravenous contrast.  Comparison: None.  Findings: Scan extends from T12-L1 through S4.  Tip of the conus is at L1-2 and appears normal.  T12-L1 through L3-4:  No significant abnormalities.  L4-5:  Focal small soft disc protrusion into the right lateral recess compressing the right L5 nerve.  Slight bulge of the remainder of the disc across the midline.  L5-S1:  Disc space narrowing with a small broad-based disc protrusion without neural impingement.  Slight hypertrophy of the left ligamentum flavum touches the left S1 nerve root sleeve.  IMPRESSION:   Focal soft disc protrusion into the right lateral recess at L4-5 compressing the right L5 nerve.  Original Report Authenticated By: Gwynn Burly, M.D.     Assessment &  Plan:

## 2012-12-20 NOTE — Assessment & Plan Note (Signed)
CBC and iron level today.

## 2012-12-20 NOTE — Assessment & Plan Note (Signed)
She has not been taking azor b/c she felt like it made her nauseous (possibly the CCB) I have asked her to start Benicar-HCT I will check her labs today to look for end organ damage and secondary causes of HTN

## 2012-12-20 NOTE — Assessment & Plan Note (Signed)
I will increase her meds to oxycodone and lyrica She will see pain management to see if ESI will help her

## 2012-12-20 NOTE — Assessment & Plan Note (Signed)
A1C today. 

## 2012-12-20 NOTE — Patient Instructions (Addendum)
Back Pain, Adult Low back pain is very common. About 1 in 5 people have back pain.The cause of low back pain is rarely dangerous. The pain often gets better over time.About half of people with a sudden onset of back pain feel better in just 2 weeks. About 8 in 10 people feel better by 6 weeks.  CAUSES Some common causes of back pain include:  Strain of the muscles or ligaments supporting the spine.  Wear and tear (degeneration) of the spinal discs.  Arthritis.  Direct injury to the back. DIAGNOSIS Most of the time, the direct cause of low back pain is not known.However, back pain can be treated effectively even when the exact cause of the pain is unknown.Answering your caregiver's questions about your overall health and symptoms is one of the most accurate ways to make sure the cause of your pain is not dangerous. If your caregiver needs more information, he or she may order lab work or imaging tests (X-rays or MRIs).However, even if imaging tests show changes in your back, this usually does not require surgery. HOME CARE INSTRUCTIONS For many people, back pain returns.Since low back pain is rarely dangerous, it is often a condition that people can learn to manageon their own.   Remain active. It is stressful on the back to sit or stand in one place. Do not sit, drive, or stand in one place for more than 30 minutes at a time. Take short walks on level surfaces as soon as pain allows.Try to increase the length of time you walk each day.  Do not stay in bed.Resting more than 1 or 2 days can delay your recovery.  Do not avoid exercise or work.Your body is made to move.It is not dangerous to be active, even though your back may hurt.Your back will likely heal faster if you return to being active before your pain is gone.  Pay attention to your body when you bend and lift. Many people have less discomfortwhen lifting if they bend their knees, keep the load close to their bodies,and  avoid twisting. Often, the most comfortable positions are those that put less stress on your recovering back.  Find a comfortable position to sleep. Use a firm mattress and lie on your side with your knees slightly bent. If you lie on your back, put a pillow under your knees.  Only take over-the-counter or prescription medicines as directed by your caregiver. Over-the-counter medicines to reduce pain and inflammation are often the most helpful.Your caregiver may prescribe muscle relaxant drugs.These medicines help dull your pain so you can more quickly return to your normal activities and healthy exercise.  Put ice on the injured area.  Put ice in a plastic bag.  Place a towel between your skin and the bag.  Leave the ice on for 15 to 20 minutes, 3 to 4 times a day for the first 2 to 3 days. After that, ice and heat may be alternated to reduce pain and spasms.  Ask your caregiver about trying back exercises and gentle massage. This may be of some benefit.  Avoid feeling anxious or stressed.Stress increases muscle tension and can worsen back pain.It is important to recognize when you are anxious or stressed and learn ways to manage it.Exercise is a great option. SEEK MEDICAL CARE IF:  You have pain that is not relieved with rest or medicine.  You have pain that does not improve in 1 week.  You have new symptoms.  You are generally   not feeling well. SEEK IMMEDIATE MEDICAL CARE IF:   You have pain that radiates from your back into your legs.  You develop new bowel or bladder control problems.  You have unusual weakness or numbness in your arms or legs.  You develop nausea or vomiting.  You develop abdominal pain.  You feel faint. Document Released: 12/15/2005 Document Revised: 06/15/2012 Document Reviewed: 05/05/2011 [facility] Patient Information 2013 [place], [place]. Hypertension As your heart beats, it forces blood through your arteries. This force is your blood pressure.  If the pressure is too high, it is called hypertension (HTN) or high blood pressure. HTN is dangerous because you may have it and not know it. High blood pressure may mean that your heart has to work harder to pump blood. Your arteries may be narrow or stiff. The extra work puts you at risk for heart disease, stroke, and other problems.  Blood pressure consists of two numbers, a higher number over a lower, 110/72, for example. It is stated as "110 over 72." The ideal is below 120 for the top number (systolic) and under 80 for the bottom (diastolic). Write down your blood pressure today. You should pay close attention to your blood pressure if you have certain conditions such as:  Heart failure.  Prior heart attack.  Diabetes  Chronic kidney disease.  Prior stroke.  Multiple risk factors for heart disease. To see if you have HTN, your blood pressure should be measured while you are seated with your arm held at the level of the heart. It should be measured at least twice. A one-time elevated blood pressure reading (especially in the Emergency Department) does not mean that you need treatment. There may be conditions in which the blood pressure is different between your right and left arms. It is important to see your caregiver soon for a recheck. Most people have essential hypertension which means that there is not a specific cause. This type of high blood pressure may be lowered by changing lifestyle factors such as:  Stress.  Smoking.  Lack of exercise.  Excessive weight.  Drug/tobacco/alcohol use.  Eating less salt. Most people do not have symptoms from high blood pressure until it has caused damage to the body. Effective treatment can often prevent, delay or reduce that damage. TREATMENT  When a cause has been identified, treatment for high blood pressure is directed at the cause. There are a large number of medications to treat HTN. These fall into several categories, and your  caregiver will help you select the medicines that are best for you. Medications may have side effects. You should review side effects with your caregiver. If your blood pressure stays high after you have made lifestyle changes or started on medicines,   Your medication(s) may need to be changed.  Other problems may need to be addressed.  Be certain you understand your prescriptions, and know how and when to take your medicine.  Be sure to follow up with your caregiver within the time frame advised (usually within two weeks) to have your blood pressure rechecked and to review your medications.  If you are taking more than one medicine to lower your blood pressure, make sure you know how and at what times they should be taken. Taking two medicines at the same time can result in blood pressure that is too low. SEEK IMMEDIATE MEDICAL CARE IF:  You develop a severe headache, blurred or changing vision, or confusion.  You have unusual weakness or numbness, or a  faint feeling.  You have severe chest or abdominal pain, vomiting, or breathing problems. MAKE SURE YOU:   Understand these instructions.  Will watch your condition.  Will get help right away if you are not doing well or get worse. Document Released: 12/15/2005 Document Revised: 03/08/2012 Document Reviewed: 08/04/2008 [facility] Patient Information 2013 [place], [place].

## 2012-12-20 NOTE — Assessment & Plan Note (Signed)
Will check her folate level today

## 2013-01-04 ENCOUNTER — Encounter: Payer: Self-pay | Admitting: Physical Medicine & Rehabilitation

## 2013-01-10 ENCOUNTER — Other Ambulatory Visit: Payer: Self-pay | Admitting: Internal Medicine

## 2013-01-10 DIAGNOSIS — M5416 Radiculopathy, lumbar region: Secondary | ICD-10-CM

## 2013-01-10 NOTE — Telephone Encounter (Signed)
Pt is calling for a refill of the Oxycodone 5mg  Immediate Release Tablets.  Pt was prescribed the medication on 12/20/13 and pt is completely out.  Pt states she does have an appt with the pain clinic on 01/25/13 at 10am (that was the earliest she could be seen).  OFFICE PLEASE FOLLOW UP WITH PATIENT IF MEDICATION CAN BE REFILLED.  PT USES [facility] IN [place].

## 2013-01-11 MED ORDER — OXYCODONE HCL 5 MG PO TABS: 5.0000 mg | ORAL_TABLET | ORAL | Status: DC | PRN

## 2013-01-11 NOTE — Telephone Encounter (Signed)
done

## 2013-01-11 NOTE — Telephone Encounter (Signed)
Patient notified

## 2013-01-25 ENCOUNTER — Encounter: Payer: Managed Care, Other (non HMO) | Attending: Physical Medicine & Rehabilitation

## 2013-01-25 ENCOUNTER — Ambulatory Visit (HOSPITAL_BASED_OUTPATIENT_CLINIC_OR_DEPARTMENT_OTHER): Payer: Managed Care, Other (non HMO) | Admitting: Physical Medicine & Rehabilitation

## 2013-01-25 ENCOUNTER — Encounter: Payer: Self-pay | Admitting: Physical Medicine & Rehabilitation

## 2013-01-25 VITALS — BP 149/74 | HR 78 | Resp 14 | Ht 65.0 in | Wt 190.0 lb

## 2013-01-25 DIAGNOSIS — M79609 Pain in unspecified limb: Secondary | ICD-10-CM | POA: Insufficient documentation

## 2013-01-25 DIAGNOSIS — Z5181 Encounter for therapeutic drug level monitoring: Secondary | ICD-10-CM

## 2013-01-25 DIAGNOSIS — M549 Dorsalgia, unspecified: Secondary | ICD-10-CM

## 2013-01-25 DIAGNOSIS — M5116 Intervertebral disc disorders with radiculopathy, lumbar region: Secondary | ICD-10-CM

## 2013-01-25 DIAGNOSIS — M5126 Other intervertebral disc displacement, lumbar region: Secondary | ICD-10-CM

## 2013-01-25 DIAGNOSIS — IMO0002 Reserved for concepts with insufficient information to code with codable children: Secondary | ICD-10-CM | POA: Insufficient documentation

## 2013-01-25 MED ORDER — DIAZEPAM 10 MG PO TABS: 10.0000 mg | ORAL_TABLET | Freq: Once | ORAL | Status: DC

## 2013-01-25 MED ORDER — TRAMADOL HCL 50 MG PO TABS: 100.0000 mg | ORAL_TABLET | Freq: Three times a day (TID) | ORAL | Status: DC | PRN

## 2013-01-25 NOTE — Progress Notes (Signed)
Subjective:    Patient ID: Madeline West, female    DOB: 11/08/1961, 52 y.o.   MRN: [id number]  HPI Back pain and right lower extremity pain onset around 2010. Lived with symptoms for several years and then discussed with primary care physician. Sent for MRI in June of 2013. Evidence of lumbar disc impinging upon right L5 nerve root No physical therapy thus far No epidural injections thus far On oxycodone as well as Lyrica Lyrica does help with her leg pain but makes her drowsy. Leg pain is mainly at night Patient would like to get off the oxycodone if possible. She does not like the way it makes her feel. She does get some constipation as well.  Patient is not using any alcohol at the current time. In the past she has. Pain Inventory Average Pain 7 Pain Right Now 9 My pain is tingling and aching  In the last 24 hours, has pain interfered with the following? General activity 6 Relation with others 5 Enjoyment of life 7 What TIME of day is your pain at its worst? night Sleep (in general) Poor  Pain is worse with: sitting, inactivity, standing and some activites Pain improves with: rest and medication Relief from Meds: 7  Mobility walk without assistance how many minutes can you walk? 15 ability to climb steps?  yes do you drive?  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  yes  Neuro/Psych weakness numbness tingling trouble walking  Prior Studies Any changes since last visit?  no MRI 06/21/2012 lumbar spine T12-L1 through L3-4: No significant abnormalities.  L4-5: Focal small soft disc protrusion into the right lateral  recess compressing the right L5 nerve. Slight bulge of the  remainder of the disc across the midline.  L5-S1: Disc space narrowing with a small broad-based disc  protrusion without neural impingement. Slight hypertrophy of the  left ligamentum flavum touches the left  S1 nerve root sleeve.  IMPRESSION:  Focal soft disc protrusion into the right lateral recess at L4-5  compressing the right L5 nerve.  Physicians involved in your care Any changes since last visit?  no   Family History  Problem Relation Age of Onset  . Alcohol abuse Other   . Drug abuse Other   . Diabetes Other   . Hyperlipidemia Other   . Cancer Other     Breast and Lung Cancer   History   Social History  . Marital Status: Married    Spouse Name: N/A    Number of Children: N/A  . Years of Education: N/A   Social History Main Topics  . Smoking status: Current Every Day Smoker  . Smokeless tobacco: Never Used  . Alcohol Use: No  . Drug Use: No  . Sexually Active: Yes    Birth Control/ Protection: Surgical   Other Topics Concern  . None   Social History Narrative  . None   Past Surgical History  Procedure Date  . Tubal ligation    Past Medical History  Diagnosis Date  . Hypertension   . Hyperlipidemia   . Anemia    BP 149/74  Pulse 78  Resp 14  Ht 5\' 5"  (1.651 m)  Wt 190 lb (86.183 kg)  BMI 31.62 kg/m2  SpO2 100%   Review of Systems  Constitutional: Positive for diaphoresis.  Gastrointestinal: Positive for constipation.  Musculoskeletal: Positive for back pain and gait problem.  Neurological: Positive for weakness and numbness.       Tingling   All other systems reviewed and are negative.       Objective:   Physical Exam  Nursing note and vitals reviewed. Constitutional: She is oriented to person, place, and time. She appears well-developed and well-nourished.  Neurological: She is alert and oriented to person, place, and time. She has normal strength. She displays no atrophy. A sensory deficit is present. She exhibits normal muscle tone.  Reflex Scores:      Tricep reflexes are 2+ on the right side and 2+ on the left side.      Bicep reflexes are 2+ on the right side and 2+ on the left side.      Brachioradialis reflexes are 2+ on the right  side.      Patellar reflexes are 2+ on the right side and 2+ on the left side.      Achilles reflexes are 1+ on the right side and 2+ on the left side.      Decreased sensation right L5 and right S1 dermatome Straight leg raising causes hamstring stretching on both side Calf circumference 36 cm when measured 10 cm below the inferior pole of the patella   Lumbar spine motion reduced Forward flexion Pain Reproduced with forward flexion     Assessment & Plan:  1. Right L5 radiculopathy secondary to right L4-L5 disc protrusion. She has had symptoms for greater than 6 months. She is taken medications but has not trialed physical therapy or epidural injections. At this point I will recommend right L5-S1 transforaminal epidural steroid injection We'll follow this with outpatient physical therapy Will trial tramadol 100 mg every 8 hours in place of oxycodone Increase Lyrica to 100 mg to take only at bedtime Discussed my recommendations with the patient and her husband, they are in agreement

## 2013-01-25 NOTE — Patient Instructions (Addendum)
Start taking Lyrica 2 tablets equal to 100 mg before bedtime Start tramadol 50 mg 1-2 tablets 3 times per day Take Valium 10 mg prior to epidural injection Will do right L5-S1 transforaminal epidural injection under fluoroscopic guidance next visit We will follow this up with physical therapy

## 2013-02-09 ENCOUNTER — Telehealth: Payer: Self-pay | Admitting: Physical Medicine & Rehabilitation

## 2013-02-09 MED ORDER — DIAZEPAM 10 MG PO TABS: 10.0000 mg | ORAL_TABLET | Freq: Once | ORAL | Status: DC

## 2013-02-09 NOTE — Telephone Encounter (Signed)
Patient rescheduled an appointment for Thursday, February 13 to February 20.  Will be having a procedure and needs a valium called into pharmacy.

## 2013-02-09 NOTE — Telephone Encounter (Signed)
Patient aware valium is ready for pick up at [facility].

## 2013-02-10 ENCOUNTER — Ambulatory Visit: Payer: Managed Care, Other (non HMO) | Admitting: Physical Medicine & Rehabilitation

## 2013-02-17 ENCOUNTER — Ambulatory Visit (HOSPITAL_BASED_OUTPATIENT_CLINIC_OR_DEPARTMENT_OTHER): Payer: Managed Care, Other (non HMO) | Admitting: Physical Medicine & Rehabilitation

## 2013-02-17 ENCOUNTER — Encounter: Payer: Self-pay | Admitting: Physical Medicine & Rehabilitation

## 2013-02-17 ENCOUNTER — Encounter: Payer: Managed Care, Other (non HMO) | Attending: Physical Medicine & Rehabilitation

## 2013-02-17 VITALS — BP 152/81 | HR 79 | Resp 14 | Ht 66.0 in | Wt 186.6 lb

## 2013-02-17 DIAGNOSIS — M79609 Pain in unspecified limb: Secondary | ICD-10-CM | POA: Insufficient documentation

## 2013-02-17 DIAGNOSIS — M5126 Other intervertebral disc displacement, lumbar region: Secondary | ICD-10-CM | POA: Insufficient documentation

## 2013-02-17 DIAGNOSIS — IMO0002 Reserved for concepts with insufficient information to code with codable children: Secondary | ICD-10-CM | POA: Insufficient documentation

## 2013-02-17 NOTE — Patient Instructions (Addendum)
Right Lumbar L5-S1 transforaminal epidural steroid injection under fluoroscopic guidance Dexamethasone, lidocaine, Omnipaque injected  Physical therapy will call you to schedule an appointment

## 2013-02-17 NOTE — Progress Notes (Signed)
Right Lumbar L5-S1 transforaminal epidural steroid injection under fluoroscopic guidance  Indication: Lumbosacral radiculitis is not relieved by medication management or other conservative care and interfering with self-care and mobility.   Informed consent was obtained after describing risk and benefits of the procedure with the patient, this includes bleeding, bruising, infection, paralysis and medication side effects.  The patient wishes to proceed and has given written consent.  Patient was placed in prone position.  The lumbar area was marked and prepped with Betadine.  It was entered with a 25-gauge 1-1/2 inch needle and one mL of 1% lidocaine was injected into the skin and subcutaneous tissue.  Then a 22-gauge 3.5 spinal needle was inserted into the Right L5-S1 intervertebral foramen under AP, lateral, and oblique view.  Then a solution containing one mL of 10 mg per mL dexamethasone and 2 mL of 1% lidocaine was injected.  The patient tolerated procedure well.  Post procedure instructions were given.  Please see post procedure form.  Preinjection pain 7/10 post injection pain 3/10

## 2013-02-17 NOTE — Progress Notes (Signed)
  PROCEDURE RECORD [facility]   Name: Madeline West DOB:[birth year] MRN: [id number]  Date:02/17/2013  Physician: Claudette Laws, MD    Nurse/CMA: Oleva Koo RN  Allergies:  Allergies  Allergen Reactions  . Sulfacetamide Sodium     Consent Signed: yes  Is patient diabetic? no  CBG today?   Pregnant: no LMP: Patient's last menstrual period was 02/17/2013. (age 50-55)  Anticoagulants: no Anti-inflammatory: no Antibiotics: no  Procedure: L5 S1 Transforaminal Lumbar Epidural Steroid Injection Position: Prone Start Time: 9:29 End Time: 9:34 Fluoro Time: 17 seconds  RN/CMA Haematologist    Time 9:13 9:38    BP 152/81 150/79    Pulse 79 79    Respirations 14 14    O2 Sat 100 99    S/S 6 6    Pain Level 9/10 3/10     D/C home with Peyton Najjar , patient A & O X 3, D/C instructions reviewed, and sits independently.

## 2013-03-02 ENCOUNTER — Ambulatory Visit: Payer: Managed Care, Other (non HMO) | Attending: Physical Medicine & Rehabilitation | Admitting: Physical Therapy

## 2013-03-02 DIAGNOSIS — M2550 Pain in unspecified joint: Secondary | ICD-10-CM | POA: Insufficient documentation

## 2013-03-02 DIAGNOSIS — M545 Low back pain, unspecified: Secondary | ICD-10-CM | POA: Insufficient documentation

## 2013-03-02 DIAGNOSIS — IMO0001 Reserved for inherently not codable concepts without codable children: Secondary | ICD-10-CM | POA: Insufficient documentation

## 2013-03-15 ENCOUNTER — Ambulatory Visit (HOSPITAL_BASED_OUTPATIENT_CLINIC_OR_DEPARTMENT_OTHER): Payer: Managed Care, Other (non HMO) | Admitting: Physical Medicine & Rehabilitation

## 2013-03-15 ENCOUNTER — Encounter: Payer: Managed Care, Other (non HMO) | Attending: Physical Medicine & Rehabilitation

## 2013-03-15 ENCOUNTER — Encounter: Payer: Self-pay | Admitting: Physical Medicine & Rehabilitation

## 2013-03-15 VITALS — BP 172/100 | HR 80 | Resp 14 | Ht 66.0 in | Wt 191.4 lb

## 2013-03-15 DIAGNOSIS — M5126 Other intervertebral disc displacement, lumbar region: Secondary | ICD-10-CM | POA: Insufficient documentation

## 2013-03-15 DIAGNOSIS — M79609 Pain in unspecified limb: Secondary | ICD-10-CM | POA: Insufficient documentation

## 2013-03-15 DIAGNOSIS — IMO0002 Reserved for concepts with insufficient information to code with codable children: Secondary | ICD-10-CM | POA: Insufficient documentation

## 2013-03-15 DIAGNOSIS — M51379 Other intervertebral disc degeneration, lumbosacral region without mention of lumbar back pain or lower extremity pain: Secondary | ICD-10-CM

## 2013-03-15 MED ORDER — BUPROPION HCL 75 MG PO TABS: 75.0000 mg | ORAL_TABLET | Freq: Two times a day (BID) | ORAL | Status: DC

## 2013-03-15 MED ORDER — TRAMADOL HCL 50 MG PO TABS: 100.0000 mg | ORAL_TABLET | Freq: Three times a day (TID) | ORAL | Status: DC | PRN

## 2013-03-15 MED ORDER — GABAPENTIN 300 MG PO CAPS: 300.0000 mg | ORAL_CAPSULE | Freq: Every day | ORAL | Status: DC

## 2013-03-15 NOTE — Patient Instructions (Signed)
Start Wellbutrin. We may need to increase dose. It will take several weeks to take effect. Chart gabapentin. 300 mg at night. May cause drowsiness. May cause dry mouth. Continue tramadol 100 mg 3 times per day Continue physical therapy exercises See me back in one month.

## 2013-03-15 NOTE — Progress Notes (Signed)
Subjective:    Patient ID: Madeline West, female    DOB: 1961-04-04, 52 y.o.   MRN: [id number]  HPI Back pain and right lower extremity pain onset around 2010. Lived with symptoms for several years and then discussed with primary care physician. Sent for MRI in June of 2013. Evidence of lumbar disc impinging upon right L5 nerve root Right leg tingling improved after right L5 transforaminal injection. Has back pain greater than leg pain. Also has had depression as it relates to pain. Sleep is poor.  Pain Inventory Average Pain 8 Pain Right Now 8 My pain is tingling and aching  In the last 24 hours, has pain interfered with the following? General activity 7 Relation with others 5 Enjoyment of life 8 What TIME of day is your pain at its worst? evening Sleep (in general) Poor  Pain is worse with: walking, sitting, inactivity, standing and some activites Pain improves with: rest and medication Relief from Meds: 8  Mobility walk without assistance ability to climb steps?  yes do you drive?  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  no  Physicians involved in your care Any changes since last visit?  no   Family History  Problem Relation Age of Onset  . Alcohol abuse Other   . Drug abuse Other   . Diabetes Other   . Hyperlipidemia Other   . Cancer Other     Breast and Lung Cancer   History   Social History  . Marital Status: Married    Spouse Name: N/A    Number of Children: N/A  . Years of Education: N/A   Social History Main Topics  . Smoking status: Current Every Day Smoker  . Smokeless tobacco: Never Used  . Alcohol Use: No  . Drug Use: No  . Sexually Active: Yes    Birth Control/ Protection: Surgical   Other Topics Concern   . None   Social History Narrative  . None   Past Surgical History  Procedure Laterality Date  . Tubal ligation     Past Medical History  Diagnosis Date  . Hypertension   . Hyperlipidemia   . Anemia    BP 172/100  Pulse 80  Resp 14  Ht 5\' 6"  (1.676 m)  Wt 191 lb 6.4 oz (86.818 kg)  BMI 30.91 kg/m2  SpO2 100%  LMP 02/17/2013   Review of Systems  Musculoskeletal: Positive for back pain and gait problem.  Neurological: Positive for weakness and numbness.  Psychiatric/Behavioral: Positive for suicidal ideas and dysphoric mood.       Does not have a plan but very depressed.  All other systems reviewed and are negative.       Objective:   Physical Exam  Nursing note and vitals reviewed. Constitutional: She is oriented to person, place, and time. She appears well-developed and well-nourished.  HENT:  Head: Normocephalic and atraumatic.  Eyes: Conjunctivae and EOM are normal. Pupils are equal, round, and reactive to light.  Neck: Normal range of motion.  Neurological: She is alert and oriented to person, place, and time.  Psychiatric: Her speech is normal. Cognition and memory are normal. She exhibits a depressed mood.   Reduced right L5 dermatomal sensation to light touch Her strength is normal  bilateral upper and lower extremity Back is no tenderness to palpation. There is decreased lumbar spine range of motion for flexion as well as extension. Lateral bending is okay      Assessment & Plan:  1. Right L5 radiculopathy secondary to right L4-L5 disc protrusion. She has had symptoms for greater than 6 months. She is taken medications ,tried physical therapy But cannot attend twice a week due to work schedule.  right L5-S1 transforaminal epidural steroid injection Gave partial relief  outpatient physical therapy Schedule allows. We'll need to do home exercise program Continue tramadol 100 mg every 8 hours in place of oxycodone Start Wellbutrin 75 twice a day may have  to bump it up to 3 times a day,And discussed need for pain psychologist Add gabapentin 300 g each bedtime. Lyrica was not tolerated well RTC 1 month Discussed medial branch blocks

## 2013-04-08 ENCOUNTER — Ambulatory Visit: Payer: Managed Care, Other (non HMO) | Admitting: Physical Medicine & Rehabilitation

## 2013-04-10 ENCOUNTER — Other Ambulatory Visit: Payer: Self-pay | Admitting: Physical Medicine & Rehabilitation

## 2013-04-29 ENCOUNTER — Ambulatory Visit: Payer: Managed Care, Other (non HMO) | Admitting: Physical Medicine & Rehabilitation

## 2013-05-11 ENCOUNTER — Other Ambulatory Visit: Payer: Self-pay | Admitting: *Deleted

## 2013-05-11 MED ORDER — BUPROPION HCL 75 MG PO TABS: 75.0000 mg | ORAL_TABLET | Freq: Two times a day (BID) | ORAL | Status: DC

## 2013-05-11 MED ORDER — GABAPENTIN 300 MG PO CAPS: 300.0000 mg | ORAL_CAPSULE | Freq: Every day | ORAL | Status: DC

## 2013-05-16 ENCOUNTER — Ambulatory Visit: Payer: Managed Care, Other (non HMO) | Admitting: Physical Medicine & Rehabilitation

## 2013-05-19 ENCOUNTER — Ambulatory Visit: Payer: Managed Care, Other (non HMO) | Admitting: Physical Medicine & Rehabilitation

## 2013-11-02 ENCOUNTER — Other Ambulatory Visit: Payer: Self-pay | Admitting: Internal Medicine

## 2013-12-04 ENCOUNTER — Other Ambulatory Visit: Payer: Self-pay | Admitting: Internal Medicine

## 2013-12-11 ENCOUNTER — Other Ambulatory Visit: Payer: Self-pay | Admitting: Internal Medicine

## 2014-01-15 ENCOUNTER — Other Ambulatory Visit: Payer: Self-pay | Admitting: Physical Medicine & Rehabilitation

## 2014-03-27 ENCOUNTER — Other Ambulatory Visit (INDEPENDENT_AMBULATORY_CARE_PROVIDER_SITE_OTHER): Payer: Managed Care, Other (non HMO)

## 2014-03-27 ENCOUNTER — Encounter: Payer: Self-pay | Admitting: Internal Medicine

## 2014-03-27 ENCOUNTER — Ambulatory Visit (INDEPENDENT_AMBULATORY_CARE_PROVIDER_SITE_OTHER): Payer: Managed Care, Other (non HMO) | Admitting: Internal Medicine

## 2014-03-27 VITALS — BP 150/98 | HR 75 | Temp 98.1°F | Resp 16 | Ht 66.0 in | Wt 194.0 lb

## 2014-03-27 DIAGNOSIS — E785 Hyperlipidemia, unspecified: Secondary | ICD-10-CM | POA: Insufficient documentation

## 2014-03-27 DIAGNOSIS — D529 Folate deficiency anemia, unspecified: Secondary | ICD-10-CM

## 2014-03-27 DIAGNOSIS — D518 Other vitamin B12 deficiency anemias: Secondary | ICD-10-CM

## 2014-03-27 DIAGNOSIS — G459 Transient cerebral ischemic attack, unspecified: Secondary | ICD-10-CM

## 2014-03-27 DIAGNOSIS — IMO0001 Reserved for inherently not codable concepts without codable children: Secondary | ICD-10-CM

## 2014-03-27 DIAGNOSIS — D509 Iron deficiency anemia, unspecified: Secondary | ICD-10-CM

## 2014-03-27 DIAGNOSIS — Z23 Encounter for immunization: Secondary | ICD-10-CM

## 2014-03-27 DIAGNOSIS — I10 Essential (primary) hypertension: Secondary | ICD-10-CM

## 2014-03-27 DIAGNOSIS — E1165 Type 2 diabetes mellitus with hyperglycemia: Principal | ICD-10-CM

## 2014-03-27 LAB — URINALYSIS, ROUTINE W REFLEX MICROSCOPIC
BILIRUBIN URINE: NEGATIVE
Ketones, ur: NEGATIVE
Leukocytes, UA: NEGATIVE
Nitrite: NEGATIVE
Specific Gravity, Urine: 1.01 (ref 1.000–1.030)
Total Protein, Urine: NEGATIVE
URINE GLUCOSE: 500 — AB
Urobilinogen, UA: 0.2 (ref 0.0–1.0)
pH: 5.5 (ref 5.0–8.0)

## 2014-03-27 LAB — BASIC METABOLIC PANEL
BUN: 8 mg/dL (ref 6–23)
CHLORIDE: 100 meq/L (ref 96–112)
CO2: 26 meq/L (ref 19–32)
Calcium: 9.2 mg/dL (ref 8.4–10.5)
Creatinine, Ser: 0.8 mg/dL (ref 0.4–1.2)
GFR: 82.84 mL/min (ref >60.00)
Glucose, Bld: 227 mg/dL — ABNORMAL HIGH (ref 70–99)
POTASSIUM: 3.9 meq/L (ref 3.5–5.1)
SODIUM: 135 meq/L (ref 135–145)

## 2014-03-27 LAB — LIPID PANEL
Cholesterol: 158 mg/dL (ref 0–200)
HDL: 50.5 mg/dL (ref >39.00)
LDL CALC: 89 mg/dL (ref 0–99)
TRIGLYCERIDES: 95 mg/dL (ref 0.0–149.0)
Total CHOL/HDL Ratio: 3
VLDL: 19 mg/dL (ref 0.0–40.0)

## 2014-03-27 LAB — HEMOGLOBIN A1C: Hgb A1c MFr Bld: 9.2 % — ABNORMAL HIGH (ref 4.6–6.5)

## 2014-03-27 LAB — TSH: TSH: 4.08 u[IU]/mL (ref 0.35–5.50)

## 2014-03-27 MED ORDER — SITAGLIPTIN PHOS-METFORMIN HCL 50-1000 MG PO TABS: 1.0000 | ORAL_TABLET | Freq: Two times a day (BID) | ORAL | Status: DC

## 2014-03-27 MED ORDER — OLMESARTAN-AMLODIPINE-HCTZ 40-10-25 MG PO TABS: 1.0000 | ORAL_TABLET | Freq: Every day | ORAL | Status: DC

## 2014-03-27 MED ORDER — ROSUVASTATIN CALCIUM 10 MG PO TABS: 10.0000 mg | ORAL_TABLET | Freq: Every day | ORAL | Status: DC

## 2014-03-27 NOTE — Progress Notes (Signed)
Subjective:    Patient ID: Madeline West, female    DOB: 04-Jul-1961, 53 y.o.   MRN: [id number]  HPI Comments: She returns for f/up - she tells me that she was admitted to [facility] a week ago for TIA (pain, numbness, tingling) in her left arm. She tells me that the brain scans were normal. All s/s have resolved. I have no records from that admission. She was placed on an SU for DM but today complains that she has had some symptomatic low blood sugars.     Review of Systems  Constitutional: Negative.  Negative for fever, chills, diaphoresis, appetite change and fatigue.  HENT: Negative.   Eyes: Negative.   Respiratory: Negative.  Negative for cough, choking, chest tightness, shortness of breath and stridor.   Cardiovascular: Negative.  Negative for chest pain, palpitations and leg swelling.  Gastrointestinal: Negative.  Negative for nausea, vomiting, abdominal pain, diarrhea, constipation and blood in stool.  Endocrine: Negative.   Genitourinary: Negative.   Musculoskeletal: Negative.   Skin: Negative.   Allergic/Immunologic: Negative.   Neurological: Negative.  Negative for dizziness, tremors, seizures, syncope, facial asymmetry, speech difficulty, weakness, light-headedness, numbness and headaches.  Hematological: Negative.  Negative for adenopathy. Does not bruise/bleed easily.  Psychiatric/Behavioral: Negative.        Objective:   Physical Exam  Vitals reviewed. Constitutional: She is oriented to person, place, and time. She appears well-developed and well-nourished. No distress.  HENT:  Head: Normocephalic and atraumatic.  Mouth/Throat: Oropharynx is clear and moist. No oropharyngeal exudate.  Eyes: Conjunctivae and EOM are normal. Pupils are equal, round, and reactive to light. Right eye exhibits no discharge. Left eye exhibits no discharge. No scleral icterus.  Neck: Normal range of motion. Neck supple. No JVD present. No tracheal deviation present. No thyromegaly present.    Cardiovascular: Normal rate, regular rhythm, normal heart sounds and intact distal pulses.  Exam reveals no gallop and no friction rub.   No murmur heard. Pulmonary/Chest: Effort normal and breath sounds normal. No stridor. No respiratory distress. She has no wheezes. She has no rales. She exhibits no tenderness.  Abdominal: Soft. Bowel sounds are normal. She exhibits no distension and no mass. There is no tenderness. There is no rebound and no guarding.  Musculoskeletal: Normal range of motion. She exhibits no edema and no tenderness.  Lymphadenopathy:    She has no cervical adenopathy.  Neurological: She is alert and oriented to person, place, and time. She has normal reflexes. She displays normal reflexes. No cranial nerve deficit. She exhibits normal muscle tone. Coordination normal.  Skin: Skin is warm and dry. No rash noted. She is not diaphoretic. No erythema. No pallor.  Psychiatric: She has a normal mood and affect. Her behavior is normal. Judgment and thought content normal.     Lab Results  Component Value Date   WBC 6.9 06/11/2012   HGB 6.8 Repeated and verified X2.* 06/11/2012   HCT 23.4 Repeated and verified X2.* 06/11/2012   PLT 230.0 06/11/2012   GLUCOSE 112* 06/11/2012   CHOL 196 06/11/2012   TRIG 159.0* 06/11/2012   HDL 43.60 06/11/2012   LDLCALC 121* 06/11/2012   ALT 13 06/11/2012   AST 20 06/11/2012   NA 138 06/11/2012   K 4.1 06/11/2012   CL 105 06/11/2012   CREATININE 0.9 06/11/2012   BUN 14 06/11/2012   CO2 27 06/11/2012   TSH 2.03 06/11/2012   INR 1.1 08/30/2008   HGBA1C 6.5 06/11/2012  Assessment & Plan:

## 2014-03-27 NOTE — Progress Notes (Signed)
Pre visit review using our clinic review tool, if applicable. No additional management support is needed unless otherwise documented below in the visit note. 

## 2014-03-27 NOTE — Patient Instructions (Signed)

## 2014-03-28 DIAGNOSIS — G459 Transient cerebral ischemic attack, unspecified: Secondary | ICD-10-CM | POA: Insufficient documentation

## 2014-03-28 LAB — CBC WITH DIFFERENTIAL/PLATELET
BASOS PCT: 0.7 % (ref 0.0–3.0)
Basophils Absolute: 0.1 10*3/uL (ref 0.0–0.1)
Eosinophils Absolute: 0.3 10*3/uL (ref 0.0–0.7)
Eosinophils Relative: 3.5 % (ref 0.0–5.0)
HCT: 33.4 % — ABNORMAL LOW (ref 36.0–46.0)
Lymphocytes Relative: 25.5 % (ref 12.0–46.0)
Lymphs Abs: 1.9 10*3/uL (ref 0.7–4.0)
MCHC: 30.2 g/dL (ref 30.0–36.0)
MONO ABS: 0.2 10*3/uL (ref 0.1–1.0)
Monocytes Relative: 2.9 % — ABNORMAL LOW (ref 3.0–12.0)
NEUTROS ABS: 5.1 10*3/uL (ref 1.4–7.7)
Neutrophils Relative %: 67.4 % (ref 43.0–77.0)
Platelets: 230 10*3/uL (ref 150.0–400.0)
RBC: 5.09 Mil/uL (ref 3.87–5.11)
RDW: 21.9 % — ABNORMAL HIGH (ref 11.5–14.6)
WBC: 7.6 10*3/uL (ref 4.5–10.5)

## 2014-03-28 NOTE — Assessment & Plan Note (Signed)
Start crestor 

## 2014-03-28 NOTE — Assessment & Plan Note (Signed)
Her BP is not well controlled I have asked her to control her BP with tribenzor

## 2014-03-28 NOTE — Assessment & Plan Note (Signed)
I will recheck her CBC today to see if this has improved

## 2014-03-28 NOTE — Assessment & Plan Note (Signed)
She tells me that her A1C was about 9 when she was admitted, I will recheck that today Also, have asked her to stop the SU and will control the blood sugar with janumet

## 2014-03-28 NOTE — Assessment & Plan Note (Signed)
Will improve risk factor modifications with better BP control, better BS control, and will start a statin

## 2014-03-29 ENCOUNTER — Encounter: Payer: Self-pay | Admitting: Internal Medicine

## 2014-05-15 ENCOUNTER — Other Ambulatory Visit: Payer: Self-pay | Admitting: Internal Medicine

## 2014-07-25 ENCOUNTER — Ambulatory Visit (INDEPENDENT_AMBULATORY_CARE_PROVIDER_SITE_OTHER): Payer: Managed Care, Other (non HMO) | Admitting: Internal Medicine

## 2014-07-25 DIAGNOSIS — F17200 Nicotine dependence, unspecified, uncomplicated: Secondary | ICD-10-CM

## 2014-08-10 ENCOUNTER — Encounter: Payer: Self-pay | Admitting: Internal Medicine

## 2014-08-10 ENCOUNTER — Ambulatory Visit (INDEPENDENT_AMBULATORY_CARE_PROVIDER_SITE_OTHER): Payer: Managed Care, Other (non HMO) | Admitting: Internal Medicine

## 2014-08-10 ENCOUNTER — Other Ambulatory Visit (INDEPENDENT_AMBULATORY_CARE_PROVIDER_SITE_OTHER): Payer: Managed Care, Other (non HMO)

## 2014-08-10 VITALS — BP 160/108 | HR 76 | Temp 98.1°F | Resp 16 | Ht 66.0 in | Wt 187.0 lb

## 2014-08-10 DIAGNOSIS — E1140 Type 2 diabetes mellitus with diabetic neuropathy, unspecified: Secondary | ICD-10-CM

## 2014-08-10 DIAGNOSIS — I10 Essential (primary) hypertension: Secondary | ICD-10-CM

## 2014-08-10 DIAGNOSIS — Z0000 Encounter for general adult medical examination without abnormal findings: Secondary | ICD-10-CM

## 2014-08-10 DIAGNOSIS — D509 Iron deficiency anemia, unspecified: Secondary | ICD-10-CM

## 2014-08-10 DIAGNOSIS — IMO0001 Reserved for inherently not codable concepts without codable children: Secondary | ICD-10-CM

## 2014-08-10 DIAGNOSIS — D518 Other vitamin B12 deficiency anemias: Secondary | ICD-10-CM

## 2014-08-10 DIAGNOSIS — D529 Folate deficiency anemia, unspecified: Secondary | ICD-10-CM

## 2014-08-10 DIAGNOSIS — E1165 Type 2 diabetes mellitus with hyperglycemia: Secondary | ICD-10-CM

## 2014-08-10 DIAGNOSIS — E1142 Type 2 diabetes mellitus with diabetic polyneuropathy: Secondary | ICD-10-CM

## 2014-08-10 DIAGNOSIS — Z1231 Encounter for screening mammogram for malignant neoplasm of breast: Secondary | ICD-10-CM

## 2014-08-10 DIAGNOSIS — E1149 Type 2 diabetes mellitus with other diabetic neurological complication: Secondary | ICD-10-CM

## 2014-08-10 DIAGNOSIS — N951 Menopausal and female climacteric states: Secondary | ICD-10-CM

## 2014-08-10 DIAGNOSIS — E785 Hyperlipidemia, unspecified: Secondary | ICD-10-CM

## 2014-08-10 LAB — COMPREHENSIVE METABOLIC PANEL
ALK PHOS: 62 U/L (ref 39–117)
ALT: 23 U/L (ref 0–35)
AST: 23 U/L (ref 0–37)
Albumin: 3.9 g/dL (ref 3.5–5.2)
BUN: 12 mg/dL (ref 6–23)
CO2: 28 mEq/L (ref 19–32)
Calcium: 9.6 mg/dL (ref 8.4–10.5)
Chloride: 105 mEq/L (ref 96–112)
Creatinine, Ser: 0.9 mg/dL (ref 0.4–1.2)
GFR: 74.91 mL/min (ref >60.00)
Glucose, Bld: 172 mg/dL — ABNORMAL HIGH (ref 70–99)
Potassium: 4.2 mEq/L (ref 3.5–5.1)
SODIUM: 139 meq/L (ref 135–145)
Total Bilirubin: 0.4 mg/dL (ref 0.2–1.2)
Total Protein: 7.9 g/dL (ref 6.0–8.3)

## 2014-08-10 LAB — LIPID PANEL
CHOL/HDL RATIO: 5
CHOLESTEROL: 269 mg/dL — AB (ref 0–200)
HDL: 53.8 mg/dL (ref >39.00)
NonHDL: 215.2
Triglycerides: 248 mg/dL — ABNORMAL HIGH (ref 0.0–149.0)
VLDL: 49.6 mg/dL — AB (ref 0.0–40.0)

## 2014-08-10 LAB — CBC WITH DIFFERENTIAL/PLATELET
BASOS PCT: 1.2 % (ref 0.0–3.0)
Basophils Absolute: 0.1 10*3/uL (ref 0.0–0.1)
EOS ABS: 0.2 10*3/uL (ref 0.0–0.7)
Eosinophils Relative: 3.4 % (ref 0.0–5.0)
HCT: 35.5 % — ABNORMAL LOW (ref 36.0–46.0)
Hemoglobin: 10.9 g/dL — ABNORMAL LOW (ref 12.0–15.0)
LYMPHS ABS: 1.7 10*3/uL (ref 0.7–4.0)
Lymphocytes Relative: 28.2 % (ref 12.0–46.0)
MCHC: 30.8 g/dL (ref 30.0–36.0)
MONO ABS: 0.2 10*3/uL (ref 0.1–1.0)
Monocytes Relative: 4 % (ref 3.0–12.0)
NEUTROS PCT: 63.2 % (ref 43.0–77.0)
Neutro Abs: 3.9 10*3/uL (ref 1.4–7.7)
Platelets: 170 10*3/uL (ref 150.0–400.0)
RBC: 5.41 Mil/uL — AB (ref 3.87–5.11)
RDW: 22.5 % — ABNORMAL HIGH (ref 11.5–15.5)
WBC: 6.2 10*3/uL (ref 4.0–10.5)

## 2014-08-10 LAB — HEMOGLOBIN A1C: Hgb A1c MFr Bld: 7.7 % — ABNORMAL HIGH (ref 4.6–6.5)

## 2014-08-10 LAB — TSH: TSH: 2.13 u[IU]/mL (ref 0.35–4.50)

## 2014-08-10 LAB — LDL CHOLESTEROL, DIRECT: Direct LDL: 204.1 mg/dL

## 2014-08-10 LAB — IBC PANEL
Iron: 24 ug/dL — ABNORMAL LOW (ref 42–145)
SATURATION RATIOS: 4.4 % — AB (ref 20.0–50.0)
Transferrin: 387.4 mg/dL — ABNORMAL HIGH (ref 212.0–360.0)

## 2014-08-10 LAB — VITAMIN B12: Vitamin B-12: 700 pg/mL (ref 211–911)

## 2014-08-10 LAB — FOLLICLE STIMULATING HORMONE: FSH: 50.6 m[IU]/mL

## 2014-08-10 LAB — FOLATE: Folate: 7.4 ng/mL (ref >5.9)

## 2014-08-10 LAB — LUTEINIZING HORMONE: LH: 39.11 m[IU]/mL

## 2014-08-10 LAB — FERRITIN: Ferritin: 2.5 ng/mL — ABNORMAL LOW (ref 10.0–291.0)

## 2014-08-10 MED ORDER — GABAPENTIN 300 MG PO CAPS: 300.0000 mg | ORAL_CAPSULE | Freq: Two times a day (BID) | ORAL | Status: DC

## 2014-08-10 MED ORDER — CANAGLIFLOZIN 300 MG PO TABS: 1.0000 | ORAL_TABLET | Freq: Every day | ORAL | Status: DC

## 2014-08-10 MED ORDER — ALOGLIPTIN-PIOGLITAZONE 25-15 MG PO TABS: 1.0000 | ORAL_TABLET | Freq: Every day | ORAL | Status: DC

## 2014-08-10 MED ORDER — AZILSARTAN-CHLORTHALIDONE 40-25 MG PO TABS: 1.0000 | ORAL_TABLET | Freq: Every day | ORAL | Status: DC

## 2014-08-10 NOTE — Assessment & Plan Note (Signed)
She reports diarrhea so will stop the metformin, will try to control her blood sugar with invokana and oseni I will monitor her renal function today She is due for an eye exam

## 2014-08-10 NOTE — Patient Instructions (Signed)

## 2014-08-10 NOTE — Assessment & Plan Note (Signed)
Will screen her celiac disease today

## 2014-08-10 NOTE — Assessment & Plan Note (Signed)
Will restart neurontin

## 2014-08-10 NOTE — Assessment & Plan Note (Signed)
I will check her [place] and LH to see if she is menopausal

## 2014-08-10 NOTE — Progress Notes (Signed)
Pre visit review using our clinic review tool, if applicable. No additional management support is needed unless otherwise documented below in the visit note. 

## 2014-08-10 NOTE — Assessment & Plan Note (Signed)
She has not been taking tribenzor and BP is not well controlled She thinks that amlodipine causes nausea Will start Edarbyclor for better BP control Today I will check her lytes and renal function

## 2014-08-10 NOTE — Assessment & Plan Note (Signed)
She has multiple vitamin deficiencies and chronic diarrhea so I will check her for celiac disease Will also recheck her CBC and Vit B12 level today

## 2014-08-10 NOTE — Progress Notes (Signed)
Subjective:    Patient ID: Madeline West, female    DOB: 1961/10/01, 53 y.o.   MRN: [id number]  Anemia Presents for follow-up visit. There has been no abdominal pain, anorexia, bruising/bleeding easily, confusion, fever, leg swelling, light-headedness, malaise/fatigue, pallor, palpitations, paresthesias, pica or weight loss. Signs of blood loss that are not present include hematemesis, hematochezia, melena, menorrhagia and vaginal bleeding. Past treatments include parenteral iron supplements, parenteral vitamin O67 and folic acid. There are no compliance problems.       Review of Systems  Constitutional: Negative.  Negative for fever, chills, weight loss, malaise/fatigue, diaphoresis, appetite change and fatigue.  HENT: Negative.   Eyes: Negative.   Respiratory: Negative.  Negative for apnea, cough, choking, chest tightness, shortness of breath, wheezing and stridor.   Cardiovascular: Negative.  Negative for chest pain, palpitations and leg swelling.  Gastrointestinal: Positive for diarrhea (chronic diarrhea). Negative for nausea, vomiting, abdominal pain, constipation, blood in stool, melena, hematochezia, abdominal distention, anal bleeding, rectal pain, anorexia and hematemesis.  Endocrine: Negative.  Negative for polydipsia, polyphagia and polyuria.  Genitourinary: Negative.  Negative for vaginal bleeding and menorrhagia.  Musculoskeletal: Negative.  Negative for arthralgias, back pain, gait problem, joint swelling, myalgias, neck pain and neck stiffness.       She complains of hot flashes and has not had a menstrual cycle for 3 months.  Skin: Negative for pallor.  Allergic/Immunologic: Negative.   Neurological: Positive for numbness (and pain in both feet). Negative for dizziness, tremors, seizures, syncope, facial asymmetry, speech difficulty, weakness, light-headedness, headaches and paresthesias.  Hematological: Negative.  Negative for adenopathy. Does not bruise/bleed easily.    Psychiatric/Behavioral: Negative.  Negative for confusion.       Objective:   Physical Exam  Vitals reviewed. Constitutional: She is oriented to person, place, and time. She appears well-developed and well-nourished. No distress.  HENT:  Head: Normocephalic and atraumatic.  Mouth/Throat: Oropharynx is clear and moist. No oropharyngeal exudate.  Eyes: Conjunctivae are normal. Right eye exhibits no discharge. Left eye exhibits no discharge. No scleral icterus.  Neck: Normal range of motion. Neck supple. No JVD present. No tracheal deviation present. No thyromegaly present.  Cardiovascular: Normal rate, regular rhythm, normal heart sounds and intact distal pulses.  Exam reveals no gallop and no friction rub.   No murmur heard. Pulmonary/Chest: Effort normal and breath sounds normal. No stridor. No respiratory distress. She has no wheezes. She has no rales. She exhibits no tenderness.  Abdominal: Soft. Bowel sounds are normal. She exhibits no distension and no mass. There is no tenderness. There is no rebound and no guarding.  Musculoskeletal: Normal range of motion. She exhibits no edema and no tenderness.  Lymphadenopathy:    She has no cervical adenopathy.  Neurological: She is oriented to person, place, and time.  Skin: Skin is warm and dry. No rash noted. She is not diaphoretic. No erythema. No pallor.  Psychiatric: She has a normal mood and affect. Her behavior is normal. Judgment and thought content normal.    Lab Results  Component Value Date   WBC 7.6 03/27/2014   HGB 10.1 Repeated and verified X2.* 03/27/2014   HCT 33.4 Repeated and verified X2.* 03/27/2014   PLT 230.0 03/27/2014   GLUCOSE 227* 03/27/2014   CHOL 158 03/27/2014   TRIG 95.0 03/27/2014   HDL 50.50 03/27/2014   LDLCALC 89 03/27/2014   ALT 13 06/11/2012   AST 20 06/11/2012   NA 135 03/27/2014   K 3.9 03/27/2014  CL 100 03/27/2014   CREATININE 0.8 03/27/2014   BUN 8 03/27/2014   CO2 26 03/27/2014   TSH 4.08 03/27/2014    INR 1.1 08/30/2008   HGBA1C 9.2* 03/27/2014        Assessment & Plan:

## 2014-08-11 ENCOUNTER — Encounter: Payer: Self-pay | Admitting: Internal Medicine

## 2014-08-11 LAB — TISSUE TRANSGLUTAMINASE, IGA: Tissue Transglutaminase Ab, IgA: 8.2 U/mL (ref <20)

## 2014-08-11 LAB — GLIADIN ANTIBODIES, SERUM
GLIADIN IGG: 11.4 U/mL (ref <20)
Gliadin IgA: 9.9 U/mL (ref <20)

## 2014-08-11 LAB — RETICULIN ANTIBODIES, IGA W TITER: Reticulin Ab, IgA: NEGATIVE

## 2014-08-25 ENCOUNTER — Inpatient Hospital Stay: Admission: RE | Admit: 2014-08-25 | Payer: Managed Care, Other (non HMO) | Source: Ambulatory Visit

## 2014-09-19 LAB — HM DIABETES EYE EXAM

## 2014-09-20 NOTE — Progress Notes (Signed)
   Subjective:    Patient ID: Madeline West, female    DOB: [birth year], 53 y.o.   MRN: [id number]  HPI  Not seen  Review of Systems     Objective:   Physical Exam        Assessment & Plan:

## 2015-08-20 ENCOUNTER — Ambulatory Visit (INDEPENDENT_AMBULATORY_CARE_PROVIDER_SITE_OTHER): Payer: Managed Care, Other (non HMO) | Admitting: Internal Medicine

## 2015-08-20 ENCOUNTER — Encounter: Payer: Self-pay | Admitting: Internal Medicine

## 2015-08-20 ENCOUNTER — Other Ambulatory Visit: Payer: Self-pay | Admitting: Internal Medicine

## 2015-08-20 VITALS — BP 142/82 | HR 73 | Temp 98.0°F | Resp 16 | Ht 66.0 in | Wt 187.0 lb

## 2015-08-20 DIAGNOSIS — D492 Neoplasm of unspecified behavior of bone, soft tissue, and skin: Secondary | ICD-10-CM

## 2015-08-20 NOTE — Patient Instructions (Signed)
Biopsy Care After Refer to this sheet in the next few weeks. These instructions provide you with information on caring for yourself after your procedure. Your caregiver may also give you more specific instructions. Your treatment has been planned according to current medical practices, but problems sometimes occur. Call your caregiver if you have any problems or questions after your procedure. If you had a fine needle biopsy, you may have soreness at the biopsy site for 1 to 2 days. If you had an open biopsy, you may have soreness at the biopsy site for 3 to 4 days. HOME CARE INSTRUCTIONS   You may resume normal diet and activities as directed.  Change bandages (dressings) as directed. If your wound was closed with a skin glue (adhesive), it will wear off and begin to peel in 7 days.  Only take over-the-counter or prescription medicines for pain, discomfort, or fever as directed by your caregiver.  Ask your caregiver when you can bathe and get your wound wet. SEEK IMMEDIATE MEDICAL CARE IF:   You have increased bleeding (more than a small spot) from the biopsy site.  You notice redness, swelling, or increasing pain at the biopsy site.  You have pus coming from the biopsy site.  You have a fever.  You notice a bad smell coming from the biopsy site or dressing.  You have a rash, have difficulty breathing, or have any allergic problems. MAKE SURE YOU:   Understand these instructions.  Will watch your condition.  Will get help right away if you are not doing well or get worse. Document Released: 07/04/2005 Document Revised: 03/08/2012 Document Reviewed: 06/12/2011 Exit[employer] Patient Information 2015 [employer]. This information is not intended to replace advice given to you by your health care provider. Make sure you discuss any questions you have with your health care provider.  

## 2015-08-20 NOTE — Progress Notes (Signed)
Pre visit review using our clinic review tool, if applicable. No additional management support is needed unless otherwise documented below in the visit note. 

## 2015-08-22 NOTE — Progress Notes (Signed)
Subjective:  Patient ID: Madeline West, female    DOB: 1961-07-19  Age: 54 y.o. MRN: [id number]  CC: Rash   HPI Madeline West presents for a lesion on her lower abdomen that has been growing over the last month. It occasionally bleeds when it is traumatized. There has been no injury to the area.  Outpatient Prescriptions Prior to Visit  Medication Sig Dispense Refill  . Canagliflozin (INVOKANA) 300 MG TABS Take 1 tablet (300 mg total) by mouth daily. 90 tablet 3  . cyanocobalamin (,VITAMIN B-12,) 1000 MCG/ML injection INJECT 1ML INTO THE MUSCLE EVERY 14 DAYS 10 mL 2  . gabapentin (NEURONTIN) 300 MG capsule Take 1 capsule (300 mg total) by mouth 2 (two) times daily. 180 capsule 3  . buPROPion (WELLBUTRIN) 75 MG tablet Take 1 tablet (75 mg total) by mouth 2 (two) times daily. 60 tablet 1  . ACCU-CHEK AVIVA PLUS test strip     . ACCU-CHEK SOFTCLIX LANCETS lancets     . ferrous sulfate 325 (65 FE) MG tablet Take 1 tablet (325 mg total) by mouth 3 (three) times daily with meals. (Patient not taking: Reported on 08/20/2015) 90 tablet 11  . rosuvastatin (CRESTOR) 10 MG tablet Take 1 tablet (10 mg total) by mouth daily. (Patient not taking: Reported on 08/20/2015) 90 tablet 3  . Alogliptin-Pioglitazone (OSENI) 25-15 MG TABS Take 1 tablet by mouth daily. (Patient not taking: Reported on 08/20/2015) 90 tablet 3  . Azilsartan-Chlorthalidone (EDARBYCLOR) 40-25 MG TABS Take 1 tablet by mouth daily. (Patient not taking: Reported on 08/20/2015) 90 tablet 3   No facility-administered medications prior to visit.    ROS Review of Systems  Constitutional: Negative.  Negative for fever, chills, diaphoresis, appetite change and fatigue.  HENT: Negative.   Eyes: Negative.   Respiratory: Negative.  Negative for cough, choking, chest tightness, shortness of breath and stridor.   Cardiovascular: Negative.  Negative for chest pain, palpitations and leg swelling.  Gastrointestinal: Negative.  Negative for  abdominal pain.  Endocrine: Negative.  Negative for polydipsia, polyphagia and polyuria.  Genitourinary: Negative.   Musculoskeletal: Negative.   Skin: Positive for wound. Negative for color change, pallor and rash.  Allergic/Immunologic: Negative.   Neurological: Negative.  Negative for dizziness.  Hematological: Negative.   Psychiatric/Behavioral: Negative.     Objective:  BP 142/82 mmHg  Pulse 73  Temp(Src) 98 F (36.7 C) (Oral)  Resp 16  Ht 5\' 6"  (1.676 m)  Wt 187 lb (84.823 kg)  BMI 30.20 kg/m2  SpO2 96%  LMP 02/17/2013  BP Readings from Last 3 Encounters:  08/20/15 142/82  08/10/14 160/108  03/27/14 150/98    Wt Readings from Last 3 Encounters:  08/20/15 187 lb (84.823 kg)  08/10/14 187 lb (84.823 kg)  03/27/14 194 lb (87.998 kg)    Physical Exam  Constitutional: She is oriented to person, place, and time. No distress.  HENT:  Mouth/Throat: Oropharynx is clear and moist. No oropharyngeal exudate.  Eyes: Conjunctivae are normal. Right eye exhibits no discharge. Left eye exhibits no discharge. No scleral icterus.  Neck: Normal range of motion. Neck supple. No JVD present. No tracheal deviation present. No thyromegaly present.  Cardiovascular: Normal rate, regular rhythm, normal heart sounds and intact distal pulses.  Exam reveals no gallop and no friction rub.   No murmur heard. Pulmonary/Chest: Effort normal and breath sounds normal. No stridor. No respiratory distress. She has no wheezes. She has no rales. She exhibits no tenderness.  Abdominal: Soft.  Bowel sounds are normal. She exhibits no distension and no mass. There is no tenderness. There is no rebound and no guarding.  Musculoskeletal: Normal range of motion. She exhibits no edema or tenderness.  Lymphadenopathy:    She has no cervical adenopathy.  Neurological: She is oriented to person, place, and time.  Skin: Skin is warm and dry. No rash noted. She is not diaphoretic. No erythema. No pallor.       The lesion was cleaned with Betadine. The area was prepped and draped in sterile fashion. Local anesthesia was obtained with instillation of 2% lidocaine with epinephrine, 2 mL were used. Next a shave biopsy was done and the lesion was removed at its base. There was a scant amount of bleeding which was treated with electrodesiccation. A pressure dressing and neosporin were applied and bleeding ceased. The lesion was sent to pathology for evaluation.    Lab Results  Component Value Date   WBC 6.2 08/10/2014   HGB 10.9* 08/10/2014   HCT 35.5* 08/10/2014   PLT 170.0 08/10/2014   GLUCOSE 172* 08/10/2014   CHOL 269* 08/10/2014   TRIG 248.0* 08/10/2014   HDL 53.80 08/10/2014   LDLDIRECT 204.1 08/10/2014   LDLCALC 89 03/27/2014   ALT 23 08/10/2014   AST 23 08/10/2014   NA 139 08/10/2014   K 4.2 08/10/2014   CL 105 08/10/2014   CREATININE 0.9 08/10/2014   BUN 12 08/10/2014   CO2 28 08/10/2014   TSH 2.13 08/10/2014   INR 1.1 08/30/2008   HGBA1C 7.7* 08/10/2014    Mr Lumbar Spine Wo Contrast  06/21/2012   *RADIOLOGY REPORT*  Clinical Data: Low back pain and right leg pain for 2-3 months.  MRI LUMBAR SPINE WITHOUT CONTRAST  Technique:  Multiplanar and multiecho pulse sequences of the lumbar spine were obtained without intravenous contrast.  Comparison: None.  Findings: Scan extends from T12-L1 through S4.  Tip of the conus is at L1-2 and appears normal.  T12-L1 through L3-4:  No significant abnormalities.  L4-5:  Focal small soft disc protrusion into the right lateral recess compressing the right L5 nerve.  Slight bulge of the remainder of the disc across the midline.  L5-S1:  Disc space narrowing with a small broad-based disc protrusion without neural impingement.  Slight hypertrophy of the left ligamentum flavum touches the left S1 nerve root sleeve.  IMPRESSION:   Focal soft disc protrusion into the right lateral recess at L4-5 compressing the right L5 nerve.  Original Report Authenticated By:  Larey Seat, M.D.   Assessment & Plan:   Madeline West was seen today for rash.  Diagnoses and all orders for this visit:  Neoplasm of skin- this is most likely a verrucous lesion and has been removed in its entirety. I await the pathology results. She will return in one week for evaluation of the area. In the meantime she will let me know if there is any bleeding or signs of wound infection. -     Dermatology pathology; Future   I have discontinued Ms. Darius's buPROPion, Azilsartan-Chlorthalidone, and Alogliptin-Pioglitazone. I am also having her maintain her ferrous sulfate, ACCU-CHEK AVIVA PLUS, ACCU-CHEK SOFTCLIX LANCETS, rosuvastatin, cyanocobalamin, gabapentin, and canagliflozin.  No orders of the defined types were placed in this encounter.     Follow-up: Return in about 1 week (around 08/27/2015).  [name], MD

## 2015-08-29 ENCOUNTER — Encounter: Payer: Self-pay | Admitting: Internal Medicine

## 2015-08-29 ENCOUNTER — Other Ambulatory Visit (INDEPENDENT_AMBULATORY_CARE_PROVIDER_SITE_OTHER): Payer: Managed Care, Other (non HMO)

## 2015-08-29 ENCOUNTER — Ambulatory Visit (INDEPENDENT_AMBULATORY_CARE_PROVIDER_SITE_OTHER): Payer: Managed Care, Other (non HMO) | Admitting: Internal Medicine

## 2015-08-29 ENCOUNTER — Telehealth: Payer: Self-pay

## 2015-08-29 VITALS — BP 144/98 | HR 84 | Temp 97.8°F | Resp 16 | Ht 66.0 in | Wt 197.0 lb

## 2015-08-29 DIAGNOSIS — E785 Hyperlipidemia, unspecified: Secondary | ICD-10-CM | POA: Diagnosis not present

## 2015-08-29 DIAGNOSIS — I10 Essential (primary) hypertension: Secondary | ICD-10-CM

## 2015-08-29 DIAGNOSIS — E118 Type 2 diabetes mellitus with unspecified complications: Secondary | ICD-10-CM

## 2015-08-29 DIAGNOSIS — D518 Other vitamin B12 deficiency anemias: Secondary | ICD-10-CM

## 2015-08-29 DIAGNOSIS — D509 Iron deficiency anemia, unspecified: Secondary | ICD-10-CM

## 2015-08-29 DIAGNOSIS — E1165 Type 2 diabetes mellitus with hyperglycemia: Secondary | ICD-10-CM

## 2015-08-29 DIAGNOSIS — D529 Folate deficiency anemia, unspecified: Secondary | ICD-10-CM

## 2015-08-29 DIAGNOSIS — Z1231 Encounter for screening mammogram for malignant neoplasm of breast: Secondary | ICD-10-CM

## 2015-08-29 DIAGNOSIS — IMO0002 Reserved for concepts with insufficient information to code with codable children: Secondary | ICD-10-CM

## 2015-08-29 DIAGNOSIS — Z1211 Encounter for screening for malignant neoplasm of colon: Secondary | ICD-10-CM | POA: Insufficient documentation

## 2015-08-29 DIAGNOSIS — B079 Viral wart, unspecified: Secondary | ICD-10-CM | POA: Diagnosis not present

## 2015-08-29 LAB — CBC WITH DIFFERENTIAL/PLATELET
BASOS PCT: 0.6 % (ref 0.0–3.0)
Basophils Absolute: 0 10*3/uL (ref 0.0–0.1)
Eosinophils Absolute: 0.2 10*3/uL (ref 0.0–0.7)
Eosinophils Relative: 2.9 % (ref 0.0–5.0)
HEMATOCRIT: 43 % (ref 36.0–46.0)
Hemoglobin: 14.5 g/dL (ref 12.0–15.0)
LYMPHS ABS: 2.2 10*3/uL (ref 0.7–4.0)
LYMPHS PCT: 30.5 % (ref 12.0–46.0)
MCHC: 33.8 g/dL (ref 30.0–36.0)
MCV: 87.4 fl (ref 78.0–100.0)
MONOS PCT: 4.6 % (ref 3.0–12.0)
Monocytes Absolute: 0.3 10*3/uL (ref 0.1–1.0)
NEUTROS ABS: 4.4 10*3/uL (ref 1.4–7.7)
Neutrophils Relative %: 61.4 % (ref 43.0–77.0)
PLATELETS: 145 10*3/uL — AB (ref 150.0–400.0)
RBC: 4.92 Mil/uL (ref 3.87–5.11)
RDW: 15.8 % — AB (ref 11.5–15.5)
WBC: 7.1 10*3/uL (ref 4.0–10.5)

## 2015-08-29 LAB — COMPREHENSIVE METABOLIC PANEL
ALT: 19 U/L (ref 0–35)
AST: 15 U/L (ref 0–37)
Albumin: 4 g/dL (ref 3.5–5.2)
Alkaline Phosphatase: 65 U/L (ref 39–117)
BUN: 15 mg/dL (ref 6–23)
CALCIUM: 9.2 mg/dL (ref 8.4–10.5)
CHLORIDE: 103 meq/L (ref 96–112)
CO2: 28 meq/L (ref 19–32)
Creatinine, Ser: 0.81 mg/dL (ref 0.40–1.20)
GFR: 78.87 mL/min (ref >60.00)
GLUCOSE: 239 mg/dL — AB (ref 70–99)
Potassium: 3.7 mEq/L (ref 3.5–5.1)
Sodium: 138 mEq/L (ref 135–145)
Total Bilirubin: 0.3 mg/dL (ref 0.2–1.2)
Total Protein: 7.1 g/dL (ref 6.0–8.3)

## 2015-08-29 LAB — URINALYSIS, ROUTINE W REFLEX MICROSCOPIC
Bilirubin Urine: NEGATIVE
Ketones, ur: NEGATIVE
Leukocytes, UA: NEGATIVE
Nitrite: POSITIVE — AB
RBC / HPF: NONE SEEN (ref 0–?)
SPECIFIC GRAVITY, URINE: 1.02 (ref 1.000–1.030)
TOTAL PROTEIN, URINE-UPE24: NEGATIVE
UROBILINOGEN UA: 0.2 (ref 0.0–1.0)
Urine Glucose: >=1000 — AB
pH: 5.5 (ref 5.0–8.0)

## 2015-08-29 LAB — FOLATE: Folate: 10.1 ng/mL (ref >5.9)

## 2015-08-29 LAB — LDL CHOLESTEROL, DIRECT: Direct LDL: 194 mg/dL

## 2015-08-29 LAB — MICROALBUMIN / CREATININE URINE RATIO
Creatinine,U: 59.7 mg/dL
MICROALB UR: 3.9 mg/dL — AB (ref 0.0–1.9)
Microalb Creat Ratio: 6.5 mg/g (ref 0.0–30.0)

## 2015-08-29 LAB — LIPID PANEL
CHOL/HDL RATIO: 6
Cholesterol: 282 mg/dL — ABNORMAL HIGH (ref 0–200)
HDL: 46.6 mg/dL (ref >39.00)

## 2015-08-29 LAB — TSH: TSH: 2.48 u[IU]/mL (ref 0.35–4.50)

## 2015-08-29 LAB — FERRITIN: FERRITIN: 11 ng/mL (ref 10.0–291.0)

## 2015-08-29 LAB — IBC PANEL
Iron: 74 ug/dL (ref 42–145)
Saturation Ratios: 16.5 % — ABNORMAL LOW (ref 20.0–50.0)
Transferrin: 320 mg/dL (ref 212.0–360.0)

## 2015-08-29 LAB — HEMOGLOBIN A1C: HEMOGLOBIN A1C: 8.8 % — AB (ref 4.6–6.5)

## 2015-08-29 LAB — VITAMIN B12: Vitamin B-12: >1500 pg/mL — ABNORMAL HIGH (ref 211–911)

## 2015-08-29 MED ORDER — CANAGLIFLOZIN 300 MG PO TABS: 300.0000 mg | ORAL_TABLET | Freq: Every day | ORAL | Status: DC

## 2015-08-29 MED ORDER — AZILSARTAN MEDOXOMIL 80 MG PO TABS: 1.0000 | ORAL_TABLET | Freq: Every day | ORAL | Status: DC

## 2015-08-29 NOTE — Progress Notes (Signed)
Pre visit review using our clinic review tool, if applicable. No additional management support is needed unless otherwise documented below in the visit note. 

## 2015-08-29 NOTE — Telephone Encounter (Signed)
Spoke with pt during OV regarding mammogram per [facility] reminder. Pt agrees to get one done. She was informed someone will be calling for scheduling

## 2015-08-29 NOTE — Patient Instructions (Signed)

## 2015-08-30 ENCOUNTER — Encounter: Payer: Self-pay | Admitting: Internal Medicine

## 2015-08-30 MED ORDER — DULAGLUTIDE 0.75 MG/0.5ML ~~LOC~~ SOAJ: 1.0000 | SUBCUTANEOUS | Status: DC

## 2015-08-30 MED ORDER — ROSUVASTATIN CALCIUM 20 MG PO TABS: 20.0000 mg | ORAL_TABLET | Freq: Every day | ORAL | Status: DC

## 2015-08-30 NOTE — Progress Notes (Signed)
Subjective:  Patient ID: Madeline West, female    DOB: 1961/03/30  Age: 54 y.o. MRN: [id number]  CC: Hypertension; Hyperlipidemia; Anemia; and Diabetes   HPI LAQUIA ROSANO presents for follow-up after a recent shave excision of a lesion on her abdomen. She states the wound has healed but there appears to be something growing back. The biopsy was positive for an excoriated verrucous lesion. She is also due for follow-up on high blood pressure and diabetes. She offers no systemic complaints. She has not been taking her meds for diabetes and hypercholesterolemia lately.  Outpatient Prescriptions Prior to Visit  Medication Sig Dispense Refill  . ACCU-CHEK AVIVA PLUS test strip     . ACCU-CHEK SOFTCLIX LANCETS lancets     . cyanocobalamin (,VITAMIN B-12,) 1000 MCG/ML injection INJECT 1ML INTO THE MUSCLE EVERY 14 DAYS 10 mL 2  . ferrous sulfate 325 (65 FE) MG tablet Take 1 tablet (325 mg total) by mouth 3 (three) times daily with meals. 90 tablet 11  . gabapentin (NEURONTIN) 300 MG capsule Take 1 capsule (300 mg total) by mouth 2 (two) times daily. 180 capsule 3  . rosuvastatin (CRESTOR) 10 MG tablet Take 1 tablet (10 mg total) by mouth daily. 90 tablet 3  . Canagliflozin (INVOKANA) 300 MG TABS Take 1 tablet (300 mg total) by mouth daily. 90 tablet 3   No facility-administered medications prior to visit.    ROS Review of Systems  Constitutional: Negative.  Negative for fever, chills, diaphoresis, appetite change and fatigue.  HENT: Negative.   Eyes: Negative.   Respiratory: Negative.  Negative for cough, choking, chest tightness, shortness of breath and stridor.   Cardiovascular: Negative.  Negative for chest pain, palpitations and leg swelling.  Gastrointestinal: Negative.  Negative for vomiting, abdominal pain, diarrhea, constipation and blood in stool.  Endocrine: Negative.  Negative for polydipsia, polyphagia and polyuria.  Genitourinary: Negative.   Musculoskeletal: Negative.   Negative for myalgias, back pain, arthralgias and neck pain.  Skin: Positive for wound. Negative for color change, pallor and rash.  Allergic/Immunologic: Negative.   Neurological: Negative.  Negative for dizziness, tremors, seizures, weakness, light-headedness, numbness and headaches.  Hematological: Negative.  Negative for adenopathy. Does not bruise/bleed easily.  Psychiatric/Behavioral: Negative.     Objective:  BP 144/98 mmHg  Pulse 84  Temp(Src) 97.8 F (36.6 C) (Oral)  Resp 16  Ht 5\' 6"  (1.676 m)  Wt 197 lb (89.359 kg)  BMI 31.81 kg/m2  SpO2 97%  LMP 02/17/2013  BP Readings from Last 3 Encounters:  08/29/15 144/98  08/20/15 142/82  08/10/14 160/108    Wt Readings from Last 3 Encounters:  08/29/15 197 lb (89.359 kg)  08/20/15 187 lb (84.823 kg)  08/10/14 187 lb (84.823 kg)    Physical Exam  Constitutional: She is oriented to person, place, and time. She appears well-developed and well-nourished. No distress.  HENT:  Head: Normocephalic and atraumatic.  Mouth/Throat: Oropharynx is clear and moist. No oropharyngeal exudate.  Eyes: Conjunctivae are normal. Right eye exhibits no discharge. Left eye exhibits no discharge. No scleral icterus.  Neck: Normal range of motion. Neck supple. No JVD present. No tracheal deviation present. No thyromegaly present.  Cardiovascular: Normal rate, regular rhythm, normal heart sounds and intact distal pulses.  Exam reveals no gallop and no friction rub.   No murmur heard. Pulmonary/Chest: Effort normal and breath sounds normal. No stridor. No respiratory distress. She has no wheezes. She has no rales. She exhibits no tenderness.  Abdominal:  Soft. Normal appearance and bowel sounds are normal. She exhibits no distension and no mass. There is no hepatosplenomegaly, splenomegaly or hepatomegaly. There is no tenderness. There is no rebound, no guarding and no CVA tenderness. No hernia. Hernia confirmed negative in the ventral area.    I  applied cryotherapy to the lesion on the abdomen. She received 20 seconnd cycles with 10 second breaks, 3 cycles of freeze Everlean Patterson were completed.  Musculoskeletal: Normal range of motion. She exhibits no edema or tenderness.  Lymphadenopathy:    She has no cervical adenopathy.  Neurological: She is oriented to person, place, and time.  Skin: Skin is warm and dry. No rash noted. She is not diaphoretic. No erythema. No pallor.  Psychiatric: She has a normal mood and affect. Her behavior is normal. Judgment and thought content normal.    Lab Results  Component Value Date   WBC 7.1 08/29/2015   HGB 14.5 08/29/2015   HCT 43.0 08/29/2015   PLT 145.0* 08/29/2015   GLUCOSE 239* 08/29/2015   CHOL 282* 08/29/2015   TRIG * 08/29/2015    431.0 Triglyceride is over 400; calculations on Lipids are invalid.   HDL 46.60 08/29/2015   LDLDIRECT 194.0 08/29/2015   LDLCALC 89 03/27/2014   ALT 19 08/29/2015   AST 15 08/29/2015   NA 138 08/29/2015   K 3.7 08/29/2015   CL 103 08/29/2015   CREATININE 0.81 08/29/2015   BUN 15 08/29/2015   CO2 28 08/29/2015   TSH 2.48 08/29/2015   INR 1.1 08/30/2008   HGBA1C 8.8* 08/29/2015   MICROALBUR 3.9* 08/29/2015    Mr Lumbar Spine Wo Contrast  06/21/2012   *RADIOLOGY REPORT*  Clinical Data: Low back pain and right leg pain for 2-3 months.  MRI LUMBAR SPINE WITHOUT CONTRAST  Technique:  Multiplanar and multiecho pulse sequences of the lumbar spine were obtained without intravenous contrast.  Comparison: None.  Findings: Scan extends from T12-L1 through S4.  Tip of the conus is at L1-2 and appears normal.  T12-L1 through L3-4:  No significant abnormalities.  L4-5:  Focal small soft disc protrusion into the right lateral recess compressing the right L5 nerve.  Slight bulge of the remainder of the disc across the midline.  L5-S1:  Disc space narrowing with a small broad-based disc protrusion without neural impingement.  Slight hypertrophy of the left ligamentum flavum  touches the left S1 nerve root sleeve.  IMPRESSION:   Focal soft disc protrusion into the right lateral recess at L4-5 compressing the right L5 nerve.  Original Report Authenticated By: Larey Seat, M.D.   Assessment & Plan:   Jakyrah was seen today for hypertension, hyperlipidemia, anemia and diabetes.  Diagnoses and all orders for this visit:  Essential hypertension, benign- her blood pressure is not adequately well controlled. I've asked her to start an ARB. Her labs today do not show any evidence of end organ damage or secondary causes for hypertension. -     Comprehensive metabolic panel; Future -     Urinalysis, Routine w reflex microscopic (not at [facility]); Future -     Azilsartan Medoxomil (EDARBI) 80 MG TABS; Take 1 tablet (80 mg total) by mouth daily.  Type II diabetes mellitus with manifestations, uncontrolled- her blood sugars are not well controlled. I have asked her to restart Invokana, and to start using Trulicity pen weekly. I will send her for diabetic education and of asked her to have her annual eye exam done. -  Lipid panel; Future -     Hemoglobin A1c; Future -     Microalbumin / creatinine urine ratio; Future -     Ambulatory referral to Ophthalmology -     canagliflozin (INVOKANA) 300 MG TABS tablet; Take 300 mg by mouth daily. -     Azilsartan Medoxomil (EDARBI) 80 MG TABS; Take 1 tablet (80 mg total) by mouth daily.  Iron deficiency anemia- improvement noted -     CBC with Differential/Platelet; Future -     IBC panel; Future -     Ferritin; Future  Hyperlipidemia with target LDL less than 100- she has not been compliant with her statin therapy and her LDL is very high. I have asked her to restart Crestor but at a higher dose of 20 mg a day. -     Lipid panel; Future -     TSH; Future  Folate deficiency anemia- improvement noted. -     CBC with Differential/Platelet; Future -     Folate; Future  Other vitamin B12 deficiency anemia- improvement noted. -      CBC with Differential/Platelet; Future -     Vitamin B12; Future  Visit for screening mammogram -     MM DIGITAL SCREENING BILATERAL; Future  Screen for colon cancer -     Ambulatory referral to Gastroenterology  Verrucous skin lesion- cryotherapy applied today. I've asked her to return in 2-3 weeks to recheck the area and to consider another treatment with cryotherapy.  Type II diabetes mellitus with complication, uncontrolled -     canagliflozin (INVOKANA) 300 MG TABS tablet; Take 300 mg by mouth daily.   I have changed Ms. Cajamarca's canagliflozin. I am also having her start on Azilsartan Medoxomil. Additionally, I am having her maintain her ferrous sulfate, ACCU-CHEK AVIVA PLUS, ACCU-CHEK SOFTCLIX LANCETS, rosuvastatin, cyanocobalamin, and gabapentin.  Meds ordered this encounter  Medications  . canagliflozin (INVOKANA) 300 MG TABS tablet    Sig: Take 300 mg by mouth daily.    Dispense:  90 tablet    Refill:  3  . Azilsartan Medoxomil (EDARBI) 80 MG TABS    Sig: Take 1 tablet (80 mg total) by mouth daily.    Dispense:  30 tablet    Refill:  11     Follow-up: Return in about 2 months (around 10/29/2015).  [name], MD

## 2015-09-05 ENCOUNTER — Ambulatory Visit: Payer: Managed Care, Other (non HMO)

## 2015-09-05 DIAGNOSIS — Z7189 Other specified counseling: Secondary | ICD-10-CM

## 2015-09-05 NOTE — Patient Instructions (Signed)
Went over with pt how to use the trulicity weekly injectable pens.

## 2015-09-10 ENCOUNTER — Encounter: Payer: Self-pay | Admitting: Internal Medicine

## 2015-09-28 ENCOUNTER — Other Ambulatory Visit: Payer: Self-pay

## 2015-09-28 DIAGNOSIS — E1140 Type 2 diabetes mellitus with diabetic neuropathy, unspecified: Secondary | ICD-10-CM

## 2015-09-28 MED ORDER — GABAPENTIN 300 MG PO CAPS: 300.0000 mg | ORAL_CAPSULE | Freq: Two times a day (BID) | ORAL | Status: DC

## 2015-09-28 NOTE — Telephone Encounter (Signed)
Dr. Ronnald Ramp out of office. Ok to fill?

## 2015-12-07 ENCOUNTER — Other Ambulatory Visit: Payer: Self-pay | Admitting: Internal Medicine

## 2016-01-30 ENCOUNTER — Encounter: Payer: Self-pay | Admitting: Internal Medicine

## 2016-01-30 ENCOUNTER — Other Ambulatory Visit (INDEPENDENT_AMBULATORY_CARE_PROVIDER_SITE_OTHER): Payer: Managed Care, Other (non HMO)

## 2016-01-30 ENCOUNTER — Ambulatory Visit (INDEPENDENT_AMBULATORY_CARE_PROVIDER_SITE_OTHER): Payer: Managed Care, Other (non HMO) | Admitting: Internal Medicine

## 2016-01-30 VITALS — BP 148/92 | HR 79 | Temp 98.4°F | Resp 16 | Ht 65.0 in | Wt 191.0 lb

## 2016-01-30 DIAGNOSIS — Z1159 Encounter for screening for other viral diseases: Secondary | ICD-10-CM

## 2016-01-30 DIAGNOSIS — I10 Essential (primary) hypertension: Secondary | ICD-10-CM

## 2016-01-30 DIAGNOSIS — D518 Other vitamin B12 deficiency anemias: Secondary | ICD-10-CM

## 2016-01-30 DIAGNOSIS — E118 Type 2 diabetes mellitus with unspecified complications: Secondary | ICD-10-CM

## 2016-01-30 DIAGNOSIS — E1165 Type 2 diabetes mellitus with hyperglycemia: Secondary | ICD-10-CM

## 2016-01-30 DIAGNOSIS — E781 Pure hyperglyceridemia: Secondary | ICD-10-CM | POA: Diagnosis not present

## 2016-01-30 DIAGNOSIS — IMO0002 Reserved for concepts with insufficient information to code with codable children: Secondary | ICD-10-CM

## 2016-01-30 DIAGNOSIS — Z794 Long term (current) use of insulin: Secondary | ICD-10-CM

## 2016-01-30 DIAGNOSIS — R413 Other amnesia: Secondary | ICD-10-CM

## 2016-01-30 DIAGNOSIS — M5116 Intervertebral disc disorders with radiculopathy, lumbar region: Secondary | ICD-10-CM

## 2016-01-30 DIAGNOSIS — E785 Hyperlipidemia, unspecified: Secondary | ICD-10-CM

## 2016-01-30 LAB — CBC WITH DIFFERENTIAL/PLATELET
BASOS PCT: 0.7 % (ref 0.0–3.0)
Basophils Absolute: 0.1 10*3/uL (ref 0.0–0.1)
EOS ABS: 0.2 10*3/uL (ref 0.0–0.7)
EOS PCT: 2.6 % (ref 0.0–5.0)
HEMATOCRIT: 45.3 % (ref 36.0–46.0)
Hemoglobin: 15.2 g/dL — ABNORMAL HIGH (ref 12.0–15.0)
LYMPHS PCT: 32.8 % (ref 12.0–46.0)
Lymphs Abs: 2.3 10*3/uL (ref 0.7–4.0)
MCHC: 33.5 g/dL (ref 30.0–36.0)
MCV: 90.4 fl (ref 78.0–100.0)
MONOS PCT: 3.6 % (ref 3.0–12.0)
Monocytes Absolute: 0.3 10*3/uL (ref 0.1–1.0)
NEUTROS ABS: 4.3 10*3/uL (ref 1.4–7.7)
Neutrophils Relative %: 60.3 % (ref 43.0–77.0)
PLATELETS: 149 10*3/uL — AB (ref 150.0–400.0)
RBC: 5.01 Mil/uL (ref 3.87–5.11)
RDW: 13.9 % (ref 11.5–15.5)
WBC: 7.1 10*3/uL (ref 4.0–10.5)

## 2016-01-30 LAB — LIPID PANEL
CHOL/HDL RATIO: 6
Cholesterol: 286 mg/dL — ABNORMAL HIGH (ref 0–200)
HDL: 51.9 mg/dL (ref >39.00)
NONHDL: 234.04
Triglycerides: 278 mg/dL — ABNORMAL HIGH (ref 0.0–149.0)
VLDL: 55.6 mg/dL — AB (ref 0.0–40.0)

## 2016-01-30 LAB — BASIC METABOLIC PANEL
BUN: 20 mg/dL (ref 6–23)
CHLORIDE: 104 meq/L (ref 96–112)
CO2: 29 meq/L (ref 19–32)
CREATININE: 0.87 mg/dL (ref 0.40–1.20)
Calcium: 9.9 mg/dL (ref 8.4–10.5)
GFR: 72.51 mL/min (ref >60.00)
Glucose, Bld: 214 mg/dL — ABNORMAL HIGH (ref 70–99)
POTASSIUM: 3.9 meq/L (ref 3.5–5.1)
Sodium: 141 mEq/L (ref 135–145)

## 2016-01-30 LAB — HEMOGLOBIN A1C: Hgb A1c MFr Bld: 6.9 % — ABNORMAL HIGH (ref 4.6–6.5)

## 2016-01-30 LAB — LDL CHOLESTEROL, DIRECT: LDL DIRECT: 205 mg/dL

## 2016-01-30 MED ORDER — METHYLPREDNISOLONE 4 MG PO TBPK: ORAL_TABLET | ORAL | Status: DC

## 2016-01-30 MED ORDER — ROSUVASTATIN CALCIUM 20 MG PO TABS: 20.0000 mg | ORAL_TABLET | Freq: Every day | ORAL | Status: DC

## 2016-01-30 MED ORDER — HYDROCODONE-ACETAMINOPHEN 10-325 MG PO TABS: 1.0000 | ORAL_TABLET | Freq: Three times a day (TID) | ORAL | Status: DC | PRN

## 2016-01-30 NOTE — Progress Notes (Signed)
Subjective:  Patient ID: Madeline West, female    DOB: 1961-05-18  Age: 55 y.o. MRN: [id number]  CC: Hypertension; Hyperlipidemia; and Back Pain   HPI Madeline West presents for a 3 week history of low back pain. She describes it as a nonradiating, throbbing and burning sensation. She has tried to control the pain with Advil without much relief from her pain. She has chronic numbness in both toes but over the last few weeks has had no new development of numbness, weakness, tingling. The pain interferes with her activities and ability to sleep. She previously had a herniated disc based on MRI report. She has had no recent trauma or injury.  Outpatient Prescriptions Prior to Visit  Medication Sig Dispense Refill  . ACCU-CHEK AVIVA PLUS test strip     . ACCU-CHEK SOFTCLIX LANCETS lancets     . Azilsartan Medoxomil (EDARBI) 80 MG TABS Take 1 tablet (80 mg total) by mouth daily. 30 tablet 11  . canagliflozin (INVOKANA) 300 MG TABS tablet Take 300 mg by mouth daily. 90 tablet 3  . cyanocobalamin (,VITAMIN B-12,) 1000 MCG/ML injection INJECT 1ML INTO THE MUSCLE EVERY 14 DAYS 10 mL 2  . Dulaglutide (TRULICITY) A999333 0000000 SOPN Inject 1 Act into the skin once a week. 4 pen 11  . ferrous sulfate 325 (65 FE) MG tablet Take 1 tablet (325 mg total) by mouth 3 (three) times daily with meals. 90 tablet 11  . gabapentin (NEURONTIN) 300 MG capsule Take 1 capsule (300 mg total) by mouth 2 (two) times daily. 180 capsule 3  . rosuvastatin (CRESTOR) 20 MG tablet Take 1 tablet (20 mg total) by mouth daily. 90 tablet 3   No facility-administered medications prior to visit.    ROS Review of Systems  Constitutional: Negative.  Negative for fever, chills, diaphoresis, appetite change and fatigue.  HENT: Negative.   Eyes: Negative.   Respiratory: Negative.  Negative for cough, choking, chest tightness, shortness of breath and stridor.   Cardiovascular: Negative.  Negative for chest pain, palpitations and leg  swelling.  Gastrointestinal: Negative.  Negative for nausea, vomiting, abdominal pain, diarrhea and constipation.  Endocrine: Negative.  Negative for polydipsia, polyphagia and polyuria.  Genitourinary: Negative.   Musculoskeletal: Positive for back pain. Negative for myalgias, joint swelling and arthralgias.  Skin: Negative.  Negative for color change and rash.  Allergic/Immunologic: Negative.   Neurological: Positive for numbness. Negative for dizziness, tremors, seizures, weakness, light-headedness and headaches.  Hematological: Negative.  Negative for adenopathy. Does not bruise/bleed easily.  Psychiatric/Behavioral: Negative.        She complains of difficulty with word finding, memory loss, easily distracted, and inability to stay focused. This is been worsening over the last year.    Objective:  BP 148/92 mmHg  Pulse 79  Temp(Src) 98.4 F (36.9 C) (Oral)  Resp 16  Ht 5\' 5"  (1.651 m)  Wt 191 lb (86.637 kg)  BMI 31.78 kg/m2  SpO2 97%  LMP 02/17/2013  BP Readings from Last 3 Encounters:  01/30/16 148/92  08/29/15 144/98  08/20/15 142/82    Wt Readings from Last 3 Encounters:  01/30/16 191 lb (86.637 kg)  08/29/15 197 lb (89.359 kg)  08/20/15 187 lb (84.823 kg)    Physical Exam  Constitutional: She is oriented to person, place, and time. She appears well-developed and well-nourished. No distress.  HENT:  Mouth/Throat: Oropharynx is clear and moist. No oropharyngeal exudate.  Eyes: Conjunctivae are normal. Right eye exhibits no discharge. Left eye  exhibits no discharge. No scleral icterus.  Neck: Normal range of motion. Neck supple. No JVD present. No tracheal deviation present. No thyromegaly present.  Cardiovascular: Normal rate, regular rhythm, normal heart sounds and intact distal pulses.  Exam reveals no gallop and no friction rub.   No murmur heard. Pulmonary/Chest: Effort normal and breath sounds normal. No stridor. No respiratory distress. She has no wheezes.  She has no rales. She exhibits no tenderness.  Abdominal: Soft. Bowel sounds are normal. She exhibits no distension and no mass. There is no tenderness. There is no rebound and no guarding.  Musculoskeletal: Normal range of motion. She exhibits no edema or tenderness.       Lumbar back: Normal. She exhibits normal range of motion, no tenderness, no bony tenderness, no swelling, no deformity and no pain.  Lymphadenopathy:    She has no cervical adenopathy.  Neurological: She is alert and oriented to person, place, and time. She has normal strength. She displays no atrophy, no tremor and normal reflexes. No cranial nerve deficit or sensory deficit. She exhibits normal muscle tone. She displays a negative Romberg sign. She displays no seizure activity. Coordination and gait normal.  Reflex Scores:      Tricep reflexes are 1+ on the right side and 1+ on the left side.      Bicep reflexes are 1+ on the right side and 1+ on the left side.      Brachioradialis reflexes are 1+ on the right side and 1+ on the left side.      Patellar reflexes are 1+ on the right side and 1+ on the left side.      Achilles reflexes are 1+ on the right side and 1+ on the left side. Neg SLR in BLE  Skin: Skin is warm and dry. No rash noted. She is not diaphoretic. No erythema. No pallor.  Psychiatric: She has a normal mood and affect. Her behavior is normal. Judgment and thought content normal.  Vitals reviewed.   Lab Results  Component Value Date   WBC 7.1 01/30/2016   HGB 15.2* 01/30/2016   HCT 45.3 01/30/2016   PLT 149.0* 01/30/2016   GLUCOSE 214* 01/30/2016   CHOL 286* 01/30/2016   TRIG 278.0* 01/30/2016   HDL 51.90 01/30/2016   LDLDIRECT 205.0 01/30/2016   LDLCALC 89 03/27/2014   ALT 19 08/29/2015   AST 15 08/29/2015   NA 141 01/30/2016   K 3.9 01/30/2016   CL 104 01/30/2016   CREATININE 0.87 01/30/2016   BUN 20 01/30/2016   CO2 29 01/30/2016   TSH 2.48 08/29/2015   INR 1.1 08/30/2008   HGBA1C 6.9*  01/30/2016   MICROALBUR 3.9* 08/29/2015    Mr Lumbar Spine Wo Contrast  06/21/2012  *RADIOLOGY REPORT* Clinical Data: Low back pain and right leg pain for 2-3 months. MRI LUMBAR SPINE WITHOUT CONTRAST Technique:  Multiplanar and multiecho pulse sequences of the lumbar spine were obtained without intravenous contrast. Comparison: None. Findings: Scan extends from T12-L1 through S4.  Tip of the conus is at L1-2 and appears normal. T12-L1 through L3-4:  No significant abnormalities. L4-5:  Focal small soft disc protrusion into the right lateral recess compressing the right L5 nerve.  Slight bulge of the remainder of the disc across the midline. L5-S1:  Disc space narrowing with a small broad-based disc protrusion without neural impingement.  Slight hypertrophy of the left ligamentum flavum touches the left S1 nerve root sleeve. IMPRESSION:  Focal soft disc protrusion into  the right lateral recess at L4-5 compressing the right L5 nerve. Original Report Authenticated By: Larey Seat, M.D.   Assessment & Plan:   Madeline West was seen today for hypertension, hyperlipidemia and back pain.  Diagnoses and all orders for this visit:  Essential hypertension, benign- her blood pressure is well controlled, electrolytes and renal function are stable. -     Basic metabolic panel; Future  Uncontrolled type 2 diabetes mellitus with complication, with long-term current use of insulin ([place])- her blood sugars are well-controlled -     Hemoglobin A1c; Future -     Lipid panel; Future -     rosuvastatin (CRESTOR) 20 MG tablet; Take 1 tablet (20 mg total) by mouth daily.  Other vitamin B12 deficiency anemia- improvement noted -     CBC with Differential/Platelet; Future  Hyperlipidemia with target LDL less than 100- her LDL is very high, I don't think she is compliant with statin therapy, I've asked her to restart rosuvastatin. -     Lipid panel; Future -     rosuvastatin (CRESTOR) 20 MG tablet; Take 1 tablet (20  mg total) by mouth daily.  Pure hyperglyceridemia- improvement noted in this, she will continue to work on her lifestyle modifications. -     Lipid panel; Future  Need for hepatitis C screening test -     Hepatitis C Antibody; Future  Lumbar disc disease with radiculopathy- she will continue Advil as needed, will also add Norco for additional relief from the pain, she has a history of disc herniation so I offered a prescription for Medrol Dosepak to reduce the pain and inflammation. -     methylPREDNISolone (MEDROL DOSEPAK) 4 MG TBPK tablet; TAKE AS DIRECTED -     HYDROcodone-acetaminophen (NORCO) 10-325 MG tablet; Take 1 tablet by mouth every 8 (eight) hours as needed.  Memory loss -     Ambulatory referral to Neurology  Uncontrolled type 2 diabetes mellitus with complication, without long-term current use of insulin ([place])  I am having Ms. Rhodus start on methylPREDNISolone and HYDROcodone-acetaminophen. I am also having her maintain her ferrous sulfate, ACCU-CHEK AVIVA PLUS, ACCU-CHEK SOFTCLIX LANCETS, canagliflozin, Azilsartan Medoxomil, Dulaglutide, gabapentin, cyanocobalamin, fluticasone, and rosuvastatin.  Meds ordered this encounter  Medications  . fluticasone ([employer] FLUTICASONE PROPIONATE) 50 MCG/ACT nasal spray    Sig: USE 1 SPRAY IN EACH NOSTRIL EVERY DAY AS DIRECTED  . methylPREDNISolone (MEDROL DOSEPAK) 4 MG TBPK tablet    Sig: TAKE AS DIRECTED    Dispense:  21 tablet    Refill:  0  . HYDROcodone-acetaminophen (NORCO) 10-325 MG tablet    Sig: Take 1 tablet by mouth every 8 (eight) hours as needed.    Dispense:  50 tablet    Refill:  0  . rosuvastatin (CRESTOR) 20 MG tablet    Sig: Take 1 tablet (20 mg total) by mouth daily.    Dispense:  90 tablet    Refill:  3     Follow-up: Return in about 4 weeks (around 02/27/2016).  [name], MD

## 2016-01-30 NOTE — Patient Instructions (Signed)

## 2016-01-30 NOTE — Progress Notes (Signed)
Pre visit review using our clinic review tool, if applicable. No additional management support is needed unless otherwise documented below in the visit note. 

## 2016-01-31 ENCOUNTER — Encounter: Payer: Self-pay | Admitting: Internal Medicine

## 2016-01-31 LAB — HEPATITIS C ANTIBODY: HCV AB: NEGATIVE

## 2016-03-06 ENCOUNTER — Encounter: Payer: Self-pay | Admitting: Neurology

## 2016-03-06 ENCOUNTER — Ambulatory Visit (INDEPENDENT_AMBULATORY_CARE_PROVIDER_SITE_OTHER): Payer: Managed Care, Other (non HMO) | Admitting: Neurology

## 2016-03-06 VITALS — BP 132/94 | HR 89 | Resp 16 | Wt 187.0 lb

## 2016-03-06 DIAGNOSIS — R4189 Other symptoms and signs involving cognitive functions and awareness: Secondary | ICD-10-CM | POA: Diagnosis not present

## 2016-03-06 DIAGNOSIS — E119 Type 2 diabetes mellitus without complications: Secondary | ICD-10-CM | POA: Diagnosis not present

## 2016-03-06 DIAGNOSIS — Z8673 Personal history of transient ischemic attack (TIA), and cerebral infarction without residual deficits: Secondary | ICD-10-CM | POA: Insufficient documentation

## 2016-03-06 DIAGNOSIS — E1142 Type 2 diabetes mellitus with diabetic polyneuropathy: Secondary | ICD-10-CM | POA: Insufficient documentation

## 2016-03-06 NOTE — Patient Instructions (Addendum)
1. Schedule MRI brain without contrast 2. Refer to [facility] for Neurocognitive testing 3. Start daily aspirin 81mg  4. Physical exercise and brain stimulation exercises (crossword puzzles, word search, etc) are important for brain health 5. Our office will call you with results, follow-up in 6 months  6. YOU HAVE BEEN SCHEDULED AT [facility] FOR MRI ON 03/17/16. PLEASE ARRIVE @ 10:30AM.   [place], [place] [address] [phone]

## 2016-03-06 NOTE — Progress Notes (Signed)
NEUROLOGY CONSULTATION NOTE  Madeline West MRN: [id number] DOB: Mar 29, 1961  Referring provider: Dr. [name] Primary care provider: Dr. [name]  Reason for consult:  Memory loss  Dear Dr Ronnald Ramp:  Thank you for your kind referral of Madeline West for consultation of the above symptoms. Although her history is well known to you, please allow me to reiterate it for the purpose of our medical record. The patient was accompanied to the clinic by her husband who also provides collateral information. Records and images were personally reviewed where available.  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 She and her husband started noticing changes around 1-2 years ago, but worse in the past 6 months. She reports that she would get turned around while driving and get lost even on familiar roads. She has directions but if she gets distracted, she would get confused. She could not find her car in the parking lot. She has missed bill payments. She works as a Press photographer and has made some mistakes at work, she would forget to add something to a [employer], or would forget to send someone to a customer. She has noticed occasional word-finding difficulties. Her husband reports she had been really good at multitasking, however now gets confused as to what she was doing. She has misplaced things and put things in the fridge when they should not be. She reports being pretty good with taking her medications except for the injections. Her husband denies any personality changes but she feels she gets easily agitated. She has been at her current job for a little over a year and reports that she is stressed at work because she does not get a lot of support.  She has occasional blurred vision, back pain, neuropathy in  both feet with numbness and occasional pain in her toes. She denies any headaches, dizziness, diplopia, dysarthria, dysphagia, neck pain, bowel/bladder dysfunction, anosmia, or tremors. She had a TIA in 2015, she reports her left arm started hurting, then after this resolved, she started feeling strange and not herself. She took a shower and "felt totally weird," then passed out in front of her husband for 30-60 seconds. She was confused after and could not get her words out, she just kept saying "ba, ba." She was back to baseline on arrival to the ER. She reports being diagnosed with diabetes at that time and started on ?warfarin. She is not taking any antiplatelet or anticoagulation medication at this time. Her father may have had dementia in his later years, but he had a "blood clot in his brain." She reports a head injury 15 years ago when she was hit by a swing on the back of her head and fell.   Laboratory Data: Lab Results  Component Value Date   WBC 7.1 01/30/2016   HGB 15.2* 01/30/2016   HCT 45.3 01/30/2016   MCV 90.4 01/30/2016   PLT 149.0* 01/30/2016     Chemistry      Component Value Date/Time   NA 141 01/30/2016 1626   K 3.9 01/30/2016 1626   CL 104 01/30/2016 1626   CO2 29 01/30/2016 1626   BUN 20 01/30/2016 1626   CREATININE 0.87 01/30/2016 1626      Component Value Date/Time   CALCIUM 9.9 01/30/2016 1626   ALKPHOS 65 08/29/2015 1628  AST 15 08/29/2015 1628   ALT 19 08/29/2015 1628   BILITOT 0.3 08/29/2015 1628     Lab Results  Component Value Date   TSH 2.48 08/29/2015   Lab Results  Component Value Date   VITAMINB12 >1500* 08/29/2015   Lab Results  Component Value Date   HGBA1C 6.9* 01/30/2016    PAST MEDICAL HISTORY: Past Medical History  Diagnosis Date  . Hypertension   . Hyperlipidemia   . Anemia   . Diabetes ([place])     PAST SURGICAL HISTORY: Past Surgical History  Procedure Laterality Date  . Tubal ligation    . Cesarean section      x 3    . Orif elbow fracture Left     MEDICATIONS: Current Outpatient Prescriptions on File Prior to Visit  Medication Sig Dispense Refill  . cyanocobalamin (,VITAMIN B-12,) 1000 MCG/ML injection INJECT 1ML INTO THE MUSCLE EVERY 14 DAYS 10 mL 2  . Dulaglutide (TRULICITY) A999333 0000000 SOPN Inject 1 Act into the skin once a week. 4 pen 11  . gabapentin (NEURONTIN) 300 MG capsule Take 1 capsule (300 mg total) by mouth 2 (two) times daily. 180 capsule 3  . Azilsartan Medoxomil (EDARBI) 80 MG TABS Take 1 tablet (80 mg total) by mouth daily. (Patient not taking: Reported on 03/06/2016) 30 tablet 11  . canagliflozin (INVOKANA) 300 MG TABS tablet Take 300 mg by mouth daily. (Patient not taking: Reported on 03/06/2016) 90 tablet 3  . rosuvastatin (CRESTOR) 20 MG tablet Take 1 tablet (20 mg total) by mouth daily. (Patient not taking: Reported on 03/06/2016) 90 tablet 3   No current facility-administered medications on file prior to visit.    ALLERGIES: Allergies  Allergen Reactions  . Lyrica [Pregabalin] Swelling    Felt like tongue swelling at night  . Metformin And Related     diarrhea  . Sulfa Antibiotics   . Sulfacetamide Sodium     FAMILY HISTORY: Family History  Problem Relation Age of Onset  . Alcohol abuse Other   . Drug abuse Other   . Diabetes Other   . Hyperlipidemia Other   . Cancer Other     Breast and Lung Cancer    SOCIAL HISTORY: Social History   Social History  . Marital Status: Married    Spouse Name: N/A  . Number of Children: 3  . Years of Education: N/A   Occupational History  . Science writer    Social History Main Topics  . Smoking status: Current Every Day Smoker -- 35 years  . Smokeless tobacco: Never Used  . Alcohol Use: 0.0 oz/week    0 Standard drinks or equivalent per week     Comment: 2-3 times week  . Drug Use: No  . Sexual Activity: Yes    Birth Control/ Protection: Surgical   Other Topics Concern  . Not on file   Social History Narrative     REVIEW OF SYSTEMS: Constitutional: No fevers, chills, or sweats, no generalized fatigue, change in appetite Eyes: No visual changes, double vision, eye pain Ear, nose and throat: No hearing loss, ear pain, nasal congestion, sore throat Cardiovascular: No chest pain, palpitations Respiratory:  No shortness of breath at rest or with exertion, wheezes GastrointestinaI: No nausea, vomiting, diarrhea, abdominal pain, fecal incontinence Genitourinary:  No dysuria, urinary retention or frequency Musculoskeletal:  No neck pain, +back pain Integumentary: No rash, pruritus, skin lesions Neurological: as above Psychiatric: No depression, insomnia, anxiety Endocrine: No palpitations, fatigue, diaphoresis, mood swings,  change in appetite, change in weight, increased thirst Hematologic/Lymphatic:  No anemia, purpura, petechiae. Allergic/Immunologic: no itchy/runny eyes, nasal congestion, recent allergic reactions, rashes  PHYSICAL EXAM: Filed Vitals:   03/06/16 0835  BP: 132/94  Pulse: 89  Resp: 16   General: No acute distress Head:  Normocephalic/atraumatic Eyes: Fundoscopic exam shows bilateral sharp discs, no vessel changes, exudates, or hemorrhages Neck: supple, no paraspinal tenderness, full range of motion Back: No paraspinal tenderness Heart: regular rate and rhythm Lungs: Clear to auscultation bilaterally. Vascular: No carotid bruits. Skin/Extremities: No rash, no edema Neurological Exam: Mental status: alert and oriented to person, place, and time, no dysarthria or aphasia, Fund of knowledge is appropriate.  Recent and remote memory are intact.  Attention and concentration are normal.    Able to name objects and repeat phrases.  Montreal Cognitive Assessment  03/06/2016  Visuospatial/ Executive (0/5) 4  Naming (0/3) 3  Attention: Read list of digits (0/2) 2  Attention: Read list of letters (0/1) 1  Attention: Serial 7 subtraction starting at 100 (0/3) 3  Language: Repeat phrase  (0/2) 2  Language : Fluency (0/1) 1  Abstraction (0/2) 2  Delayed Recall (0/5) 5  Orientation (0/6) 6  Total 29  Adjusted Score (based on education) 29   Cranial nerves: CN I: not tested CN II: pupils equal, round and reactive to light, visual fields intact, fundi unremarkable. CN III, IV, VI:  full range of motion, no nystagmus, no ptosis CN V: facial sensation intact CN VII: upper and lower face symmetric CN VIII: hearing intact to finger rub CN IX, X: gag intact, uvula midline CN XI: sternocleidomastoid and trapezius muscles intact CN XII: tongue midline Bulk & Tone: normal, no fasciculations. Motor: 5/5 throughout with no pronator drift. Sensation: intact to light touch, cold, pin, vibration and joint position sense.  No extinction to double simultaneous stimulation.  Romberg test negative Deep Tendon Reflexes: unable to elicit reflexes, no ankle clonus Plantar responses: downgoing bilaterally Cerebellar: no incoordination on finger to nose, heel to shin. No dysdiadochokinesia Gait: narrow-based and steady, able to tandem walk adequately. Tremor: none  IMPRESSION: This is a pleasant 55 year old right-handed woman with vascular risk factors including diabetes, hyperlipidemia, TIA in 2015 with transient expressive aphasia after syncope, presenting for cognitive changes that started 1-2 years ago, worse in the past 6 months. It has started to affect her job. Her MOCA score today is normal 29/30, neurological exam non-focal. We discussed different causes of memory changes, TSH and B12 are normal. MRI brain without contrast will be ordered to assess for underlying structural abnormality. We also discussed effects of mood and stress on memory, symptoms may have worsened with stress at job. She will be referred for Neuropsychological evaluation to further delineate her cognitive complaints. With history of TIA, would recommend starting a daily aspirin 81mg , as well as continued control of  vascular risk factors. We discussed the importance of physical exercise and brain stimulation exercises for brain health. She will follow-up in 6 months and knows to call for any changes.   Thank you for allowing me to participate in the care of this patient. Please do not hesitate to call for any questions or concerns.   Madeline West, M.D.  CC: Dr. Ronnald Ramp

## 2016-03-07 ENCOUNTER — Telehealth: Payer: Self-pay | Admitting: Family Medicine

## 2016-03-07 NOTE — Telephone Encounter (Signed)
Lmovm to call me back. Insurance wouldn't cover brain MRI due no dx of dementia, no reports of decline, & passing MOCA. They did have a recommendation of patient having neuropsychological testing which we have referred her for.

## 2016-03-10 NOTE — Telephone Encounter (Signed)
PT returned your call from Friday/Dawn CB# [phone]

## 2016-03-10 NOTE — Telephone Encounter (Signed)
Spoke with patient. Let her know that we had to cancel her MRI due to her ins not approving scan reason for denial she didn't meet critieria. She is to proceed with neuropsych testing.

## 2016-07-03 ENCOUNTER — Ambulatory Visit (INDEPENDENT_AMBULATORY_CARE_PROVIDER_SITE_OTHER): Payer: Managed Care, Other (non HMO) | Admitting: Family

## 2016-07-03 ENCOUNTER — Encounter: Payer: Self-pay | Admitting: Family

## 2016-07-03 ENCOUNTER — Ambulatory Visit (INDEPENDENT_AMBULATORY_CARE_PROVIDER_SITE_OTHER)
Admission: RE | Admit: 2016-07-03 | Discharge: 2016-07-03 | Disposition: A | Discharge status: Home / Self Care | Payer: Managed Care, Other (non HMO) | Source: Ambulatory Visit | Attending: Family | Admitting: Family

## 2016-07-03 VITALS — BP 160/100 | HR 84 | Temp 98.5°F | Ht 65.0 in | Wt 194.0 lb

## 2016-07-03 DIAGNOSIS — R21 Rash and other nonspecific skin eruption: Secondary | ICD-10-CM

## 2016-07-03 MED ORDER — DOXYCYCLINE HYCLATE 100 MG PO TABS: 100.0000 mg | ORAL_TABLET | Freq: Two times a day (BID) | ORAL | Status: DC

## 2016-07-03 MED ORDER — MUPIROCIN CALCIUM 2 % EX CREA: 1.0000 "application " | TOPICAL_CREAM | Freq: Three times a day (TID) | CUTANEOUS | Status: DC

## 2016-07-03 NOTE — Patient Instructions (Signed)
Xrays to evaluate bone.   Warm compresses.  IF WORSENS in ANY way, please let us know and go to emergency room;.  If there is no improvement in your symptoms, or if there is any worsening of symptoms, or if you have any additional concerns, please return for re-evaluation; or, if we are closed, consider going to the Emergency Room for evaluation if symptoms urgent.

## 2016-07-03 NOTE — Progress Notes (Signed)
Pre visit review using our clinic review tool, if applicable. No additional management support is needed unless otherwise documented below in the visit note. 

## 2016-07-03 NOTE — Progress Notes (Signed)
Subjective:    Patient ID: Madeline West, female    DOB: 1961/08/28, 55 y.o.   MRN: [id number]   Madeline West is a 55 y.o. female who presents today for an acute visit.    HPI Comments: Patient here for evaluation of right elbow itching and swelling for past 4 days. Mild pain. Primary complaint is intense itching which topical antihistamine cream has offered some relief. She was traveling over the weekend in the [place] when this occurred. No injury or ticks, spiders seen near lesion. No fever, chills, fatigue.   Past Medical History  Diagnosis Date  . Hypertension   . Hyperlipidemia   . Anemia   . Diabetes ([place])    Allergies: Lyrica; Metformin and related; Sulfa antibiotics; and Sulfacetamide sodium Current Outpatient Prescriptions on File Prior to Visit  Medication Sig Dispense Refill  . Azilsartan Medoxomil (EDARBI) 80 MG TABS Take 1 tablet (80 mg total) by mouth daily. 30 tablet 11  . canagliflozin (INVOKANA) 300 MG TABS tablet Take 300 mg by mouth daily. 90 tablet 3  . cyanocobalamin (,VITAMIN B-12,) 1000 MCG/ML injection INJECT 1ML INTO THE MUSCLE EVERY 14 DAYS 10 mL 2  . Dulaglutide (TRULICITY) A999333 0000000 SOPN Inject 1 Act into the skin once a week. 4 pen 11  . gabapentin (NEURONTIN) 300 MG capsule Take 1 capsule (300 mg total) by mouth 2 (two) times daily. 180 capsule 3  . rosuvastatin (CRESTOR) 20 MG tablet Take 1 tablet (20 mg total) by mouth daily. 90 tablet 3   No current facility-administered medications on file prior to visit.    Social History  Substance Use Topics  . Smoking status: Current Every Day Smoker -- 35 years  . Smokeless tobacco: Never Used  . Alcohol Use: 0.0 oz/week    0 Standard drinks or equivalent per week     Comment: 2-3 times week    Review of Systems  Constitutional: Negative for fever and chills.  Respiratory: Negative for cough.   Cardiovascular: Negative for chest pain and palpitations.  Gastrointestinal: Negative for nausea and  vomiting.      Objective:    BP 160/100 mmHg  Pulse 84  Temp(Src) 98.5 F (36.9 C) (Oral)  Ht 5\' 5"  (1.651 m)  Wt 194 lb (87.998 kg)  BMI 32.28 kg/m2  SpO2 97%  LMP 02/17/2013   Physical Exam  Constitutional: She appears well-developed and well-nourished.  Eyes: Conjunctivae are normal.  Cardiovascular: Normal rate, regular rhythm, normal heart sounds and normal pulses.   Pulmonary/Chest: Effort normal and breath sounds normal. She has no wheezes. She has no rhonchi. She has no rales.  Neurological: She is alert.  Skin: Skin is warm and dry. Rash noted. Rash is vesicular.  3 confluent circular vesicular lesions over erythematous base. The largest lesion ( center) , approx 2 cm in diameter, is fluctuant and scant clear fluid was able to be expressed. Increase in warm. Surrounding swelling noted without erythema. Surrounding skin intact. No dark, depressed center, or eschar.   Psychiatric: She has a normal mood and affect. Her speech is normal and behavior is normal. Thought content normal.  Vitals reviewed.      Assessment & Plan:   1. Rash and nonspecific skin eruption Nonspecific vesicular rash. Unable to express discharge which would support I &D being appropriate. Suspect possible insect bite as confluent. No necrosis to suggest spider such as brown recluse.  Pending xrays to evaluate for bone infection or gas due to localized swelling  proximal to joint. Patient and I agreed on topical and oral antibiotics. Return precautions given.   - doxycycline (VIBRA-TABS) 100 MG tablet; Take 1 tablet (100 mg total) by mouth 2 (two) times daily.  Dispense: 14 tablet; Refill: 0 - DG Elbow Complete Right; Future - DG Forearm Right; Future - mupirocin cream (BACTROBAN) 2 %; Apply 1 application topically 3 (three) times daily.  Dispense: 15 g; Refill: 0   I am having Ms. Cotrell maintain her canagliflozin, Azilsartan Medoxomil, Dulaglutide, gabapentin, cyanocobalamin, and  rosuvastatin.   No orders of the defined types were placed in this encounter.     Start medications as prescribed and explained to patient on After Visit Summary ( AVS). Risks, benefits, and alternatives of the medications and treatment plan prescribed today were discussed, and patient expressed understanding.   Education regarding symptom management and diagnosis given to patient.   Follow-up:Plan follow-up and return precautions given if any worsening symptoms or change in condition.   Continue to follow with [name], MD for routine health maintenance.   Vanetta Mulders and I agreed with plan.   Mable Paris, FNP

## 2016-08-29 ENCOUNTER — Ambulatory Visit: Payer: Managed Care, Other (non HMO) | Admitting: Neurology

## 2016-09-12 ENCOUNTER — Other Ambulatory Visit: Payer: Self-pay | Admitting: Internal Medicine

## 2016-09-12 DIAGNOSIS — E118 Type 2 diabetes mellitus with unspecified complications: Principal | ICD-10-CM

## 2016-09-12 DIAGNOSIS — E1165 Type 2 diabetes mellitus with hyperglycemia: Secondary | ICD-10-CM

## 2016-09-12 DIAGNOSIS — IMO0002 Reserved for concepts with insufficient information to code with codable children: Secondary | ICD-10-CM

## 2016-12-18 ENCOUNTER — Other Ambulatory Visit: Payer: Self-pay | Admitting: Internal Medicine

## 2016-12-18 DIAGNOSIS — E1140 Type 2 diabetes mellitus with diabetic neuropathy, unspecified: Secondary | ICD-10-CM

## 2016-12-27 ENCOUNTER — Other Ambulatory Visit: Payer: Self-pay | Admitting: Internal Medicine

## 2017-01-09 ENCOUNTER — Telehealth: Payer: Self-pay

## 2017-01-09 DIAGNOSIS — E1140 Type 2 diabetes mellitus with diabetic neuropathy, unspecified: Secondary | ICD-10-CM

## 2017-01-09 MED ORDER — GABAPENTIN 300 MG PO CAPS
300.0000 mg | ORAL_CAPSULE | Freq: Two times a day (BID) | ORAL | 0 refills | Status: DC
Start: 2017-01-09 — End: 2017-01-12

## 2017-01-09 NOTE — Telephone Encounter (Signed)
Pt called and scheduled appt for Monday 1/15 at 1:45. She is asking for a prescription to be written until she is able to make it this appt. Please advise thanks.

## 2017-01-09 NOTE — Telephone Encounter (Signed)
erx sent for 30 day.

## 2017-01-09 NOTE — Telephone Encounter (Signed)
Pt needs an appt per the last time it was filled. Please let me know when appt is scheduled and I will have a short rx sent to pharmacy.

## 2017-01-09 NOTE — Telephone Encounter (Signed)
Left patient VM with Sefannie's response.

## 2017-01-12 ENCOUNTER — Encounter: Payer: Self-pay | Admitting: Internal Medicine

## 2017-01-12 ENCOUNTER — Ambulatory Visit (INDEPENDENT_AMBULATORY_CARE_PROVIDER_SITE_OTHER): Payer: Managed Care, Other (non HMO) | Admitting: Internal Medicine

## 2017-01-12 ENCOUNTER — Other Ambulatory Visit (INDEPENDENT_AMBULATORY_CARE_PROVIDER_SITE_OTHER): Payer: Managed Care, Other (non HMO)

## 2017-01-12 VITALS — BP 198/100 | HR 79 | Temp 98.2°F | Resp 16 | Ht 66.0 in | Wt 173.1 lb

## 2017-01-12 DIAGNOSIS — D520 Dietary folate deficiency anemia: Secondary | ICD-10-CM

## 2017-01-12 DIAGNOSIS — Z1231 Encounter for screening mammogram for malignant neoplasm of breast: Secondary | ICD-10-CM

## 2017-01-12 DIAGNOSIS — E785 Hyperlipidemia, unspecified: Secondary | ICD-10-CM

## 2017-01-12 DIAGNOSIS — F17200 Nicotine dependence, unspecified, uncomplicated: Secondary | ICD-10-CM

## 2017-01-12 DIAGNOSIS — E119 Type 2 diabetes mellitus without complications: Secondary | ICD-10-CM | POA: Diagnosis not present

## 2017-01-12 DIAGNOSIS — I10 Essential (primary) hypertension: Secondary | ICD-10-CM

## 2017-01-12 DIAGNOSIS — E781 Pure hyperglyceridemia: Secondary | ICD-10-CM

## 2017-01-12 DIAGNOSIS — E1140 Type 2 diabetes mellitus with diabetic neuropathy, unspecified: Secondary | ICD-10-CM

## 2017-01-12 DIAGNOSIS — Z124 Encounter for screening for malignant neoplasm of cervix: Secondary | ICD-10-CM | POA: Insufficient documentation

## 2017-01-12 DIAGNOSIS — D518 Other vitamin B12 deficiency anemias: Secondary | ICD-10-CM | POA: Diagnosis not present

## 2017-01-12 DIAGNOSIS — D508 Other iron deficiency anemias: Secondary | ICD-10-CM

## 2017-01-12 DIAGNOSIS — G459 Transient cerebral ischemic attack, unspecified: Secondary | ICD-10-CM

## 2017-01-12 DIAGNOSIS — Z0000 Encounter for general adult medical examination without abnormal findings: Secondary | ICD-10-CM

## 2017-01-12 LAB — IBC PANEL
IRON: 76 ug/dL (ref 42–145)
SATURATION RATIOS: 17.4 % — AB (ref 20.0–50.0)
TRANSFERRIN: 312 mg/dL (ref 212.0–360.0)

## 2017-01-12 LAB — FERRITIN: Ferritin: 49 ng/mL (ref 10.0–291.0)

## 2017-01-12 LAB — VITAMIN B12: Vitamin B-12: 1052 pg/mL — ABNORMAL HIGH (ref 211–911)

## 2017-01-12 LAB — FOLATE: FOLATE: 14.3 ng/mL (ref >5.9)

## 2017-01-12 MED ORDER — ASPIRIN EC 81 MG PO TBEC: 81.0000 mg | DELAYED_RELEASE_TABLET | Freq: Every day | ORAL | 3 refills | Status: AC

## 2017-01-12 MED ORDER — TELMISARTAN 80 MG PO TABS: 80.0000 mg | ORAL_TABLET | Freq: Every day | ORAL | 1 refills | Status: AC

## 2017-01-12 MED ORDER — ROSUVASTATIN CALCIUM 20 MG PO TABS: 20.0000 mg | ORAL_TABLET | Freq: Every day | ORAL | 3 refills | Status: AC

## 2017-01-12 MED ORDER — DULAGLUTIDE 0.75 MG/0.5ML ~~LOC~~ SOAJ: 1.0000 | SUBCUTANEOUS | 3 refills | Status: DC

## 2017-01-12 MED ORDER — GABAPENTIN 300 MG PO CAPS: 300.0000 mg | ORAL_CAPSULE | Freq: Two times a day (BID) | ORAL | 5 refills | Status: DC

## 2017-01-12 NOTE — Progress Notes (Signed)
Pre visit review using our clinic review tool, if applicable. No additional management support is needed unless otherwise documented below in the visit note. 

## 2017-01-12 NOTE — Progress Notes (Signed)
Subjective:  Patient ID: Madeline West, female    DOB: 04-27-1961  Age: 56 y.o. MRN: [id number]  CC: Annual Exam; Hypertension; Hyperlipidemia; Anemia; and Diabetes   HPI SHANIYAH GUERTIN presents for a CPX. She continues to struggle with compliance and has not recently been taking her ARB. She tells me her blood pressure has not been well controlled but fortunately, she has had no episodes of headache/blurred vision/chest pain/shortness of breath/palpitations/edema/fatigue.   She doesn't monitor her blood sugar but she thinks it has been well controlled since she had no polys. She has ongoing burning and tingling in her feet but says the symptoms are well controlled with gabapentin.    Outpatient Medications Prior to Visit  Medication Sig Dispense Refill  . cyanocobalamin (,VITAMIN B-12,) 1000 MCG/ML injection INJECT 1ML INTO THE MUSCLE EVERY 14 DAYS 10 mL 1  . gabapentin (NEURONTIN) 300 MG capsule Take 1 capsule (300 mg total) by mouth 2 (two) times daily. 60 capsule 0  . TRULICITY A999333 0000000 SOPN INJECT 1 ACT INTO THE SKIN ONCE A WEEK. 4 pen 5  . Azilsartan Medoxomil (EDARBI) 80 MG TABS Take 1 tablet (80 mg total) by mouth daily. (Patient not taking: Reported on 01/12/2017) 30 tablet 11  . canagliflozin (INVOKANA) 300 MG TABS tablet Take 300 mg by mouth daily. (Patient not taking: Reported on 01/12/2017) 90 tablet 3  . doxycycline (VIBRA-TABS) 100 MG tablet Take 1 tablet (100 mg total) by mouth 2 (two) times daily. (Patient not taking: Reported on 01/12/2017) 14 tablet 0  . mupirocin cream (BACTROBAN) 2 % Apply 1 application topically 3 (three) times daily. (Patient not taking: Reported on 01/12/2017) 15 g 0  . rosuvastatin (CRESTOR) 20 MG tablet Take 1 tablet (20 mg total) by mouth daily. (Patient not taking: Reported on 01/12/2017) 90 tablet 3   No facility-administered medications prior to visit.     ROS Review of Systems  Constitutional: Negative for appetite change, chills,  diaphoresis, fatigue and unexpected weight change.  HENT: Negative.   Eyes: Negative for visual disturbance.  Respiratory: Negative for cough, choking, chest tightness, shortness of breath and wheezing.   Cardiovascular: Negative.  Negative for chest pain and leg swelling.  Gastrointestinal: Negative for abdominal pain, constipation, diarrhea, nausea and vomiting.  Endocrine: Negative for cold intolerance, heat intolerance, polydipsia, polyphagia and polyuria.  Genitourinary: Negative.  Negative for decreased urine volume, difficulty urinating, dysuria, flank pain, frequency and urgency.  Musculoskeletal: Negative.  Negative for back pain, myalgias and neck pain.  Skin: Negative.  Negative for color change and rash.  Allergic/Immunologic: Negative.   Neurological: Positive for numbness. Negative for dizziness, syncope, weakness, light-headedness and headaches.  Hematological: Negative.  Negative for adenopathy. Does not bruise/bleed easily.  Psychiatric/Behavioral: Negative.     Objective:  BP (!) 198/100   Pulse 79   Temp 98.2 F (36.8 C) (Oral)   Resp 16   Ht 5\' 6"  (1.676 m)   Wt 173 lb 1.9 oz (78.5 kg)   LMP 02/17/2013   SpO2 96%   BMI 27.94 kg/m   BP Readings from Last 3 Encounters:  01/12/17 (!) 198/100  07/03/16 (!) 160/100  03/06/16 (!) 132/94    Wt Readings from Last 3 Encounters:  01/12/17 173 lb 1.9 oz (78.5 kg)  07/03/16 194 lb (88 kg)  03/06/16 187 lb (84.8 kg)    Physical Exam  Constitutional: She is oriented to person, place, and time. She appears well-developed and well-nourished. No distress.  HENT:  Mouth/Throat: Oropharynx is clear and moist. No oropharyngeal exudate.  Eyes: Conjunctivae are normal. Right eye exhibits no discharge. Left eye exhibits no discharge. No scleral icterus.  Neck: Normal range of motion. Neck supple. No JVD present. No tracheal deviation present. No thyromegaly present.  Cardiovascular: Normal rate, regular rhythm, normal  heart sounds and intact distal pulses.  Exam reveals no gallop and no friction rub.   No murmur heard. Pulmonary/Chest: Effort normal and breath sounds normal. No stridor. No respiratory distress. She has no wheezes. She has no rales. She exhibits no tenderness.  Abdominal: Soft. Bowel sounds are normal. She exhibits no distension and no mass. There is no tenderness. There is no rebound and no guarding.  Musculoskeletal: Normal range of motion. She exhibits no edema, tenderness or deformity.  Lymphadenopathy:    She has no cervical adenopathy.  Neurological: She is oriented to person, place, and time.  Skin: Skin is warm and dry. No rash noted. She is not diaphoretic. No erythema. No pallor.  Psychiatric: She has a normal mood and affect. Her behavior is normal. Judgment and thought content normal.  Vitals reviewed.   Lab Results  Component Value Date   WBC 7.1 01/30/2016   HGB 15.2 (H) 01/30/2016   HCT 45.3 01/30/2016   PLT 149.0 (L) 01/30/2016   GLUCOSE 214 (H) 01/30/2016   CHOL 286 (H) 01/30/2016   TRIG 278.0 (H) 01/30/2016   HDL 51.90 01/30/2016   LDLDIRECT 205.0 01/30/2016   LDLCALC 89 03/27/2014   ALT 19 08/29/2015   AST 15 08/29/2015   NA 141 01/30/2016   K 3.9 01/30/2016   CL 104 01/30/2016   CREATININE 0.87 01/30/2016   BUN 20 01/30/2016   CO2 29 01/30/2016   TSH 2.48 08/29/2015   INR 1.1 08/30/2008   HGBA1C 6.9 (H) 01/30/2016   MICROALBUR 3.9 (H) 08/29/2015    Dg Elbow Complete Right  Result Date: 07/04/2016 CLINICAL DATA:  Abscess.  Swelling and pain. EXAM: RIGHT ELBOW - COMPLETE 3+ VIEW COMPARISON:  No recent prior. FINDINGS: Diffuse soft tissue swelling.No radiopaque foreign body. No subcutaneous air noted. No evidence of elbow joint effusion. No focal bony abnormality . If further evaluation for abscess is needed MRI can be obtained. IMPRESSION: Diffuse soft tissue swelling.  Exam otherwise unremarkable . Electronically Signed   By: Marcello Moores  Register   On:  07/04/2016 07:38   Dg Forearm Right  Result Date: 07/04/2016 CLINICAL DATA:  Swelling of the soft tissues of the proximal forearm and right elbow with pain and erythema for 2 days and inability to be in the elbow; suspect abscess; no known injury. EXAM: RIGHT FOREARM - 2 VIEW COMPARISON:  Elbow series of today's date FINDINGS: AP and lateral views of the right forearm reveal the bones to be adequately mineralized. There is no lytic or blastic lesion or periosteal reaction. The overlying soft tissues exhibit no foreign bodies or gas collections. The observed portions of the wrist are unremarkable. The observed portions of the elbow reveal no bony abnormalities nor joint effusion. IMPRESSION: No acute bony abnormality of the right radius or ulna. Soft tissue swelling over the extensor surface of the proximal forearm consistent with clinically suspected cellulitis. Electronically Signed   By: David  Martinique M.D.   On: 07/04/2016 07:39    Assessment & Plan:   Shiffy was seen today for annual exam, hypertension, hyperlipidemia, anemia and diabetes.  Diagnoses and all orders for this visit:  Essential hypertension, benign- her blood pressure is not  adequately well controlled, will restart an ARB , will also monitor her electrolytes and renal function. -     telmisartan (MICARDIS) 80 MG tablet; Take 1 tablet (80 mg total) by mouth daily. -     aspirin EC 81 MG tablet; Take 1 tablet (81 mg total) by mouth daily. -     Comprehensive metabolic panel; Future -     Urinalysis, Routine w reflex microscopic; Future  Type 2 diabetes mellitus without complication, without long-term current use of insulin ([place])- I will check her A1c and make further recommendations depending on the results. Will start ARB for renal protection. -     telmisartan (MICARDIS) 80 MG tablet; Take 1 tablet (80 mg total) by mouth daily. -     Dulaglutide (TRULICITY) A999333 0000000 SOPN; Inject 1 Act into the skin once a week. -      Ambulatory referral to Ophthalmology -     aspirin EC 81 MG tablet; Take 1 tablet (81 mg total) by mouth daily. -     Hemoglobin A1c; Future -     Microalbumin / creatinine urine ratio; Future  Dietary folate deficiency anemia- will recheck her H&H and monitor her folate level -     Folate; Future -     CBC with Differential/Platelet; Future  Hyperlipidemia with target LDL less than 100- I will check her fasting lipid panel, will restart a statin, will make further recommendations if she has not achieved her LDL goal -     rosuvastatin (CRESTOR) 20 MG tablet; Take 1 tablet (20 mg total) by mouth daily. -     aspirin EC 81 MG tablet; Take 1 tablet (81 mg total) by mouth daily. -     TSH; Future -     Lipid panel; Future  Other iron deficiency anemia- will monitor her H&H and her iron level -     IBC panel; Future -     Ferritin; Future -     CBC with Differential/Platelet; Future  Other vitamin B12 deficiency anemia - will monitor H&H and her B12 level -     Vitamin B12; Future -     Folate; Future -     CBC with Differential/Platelet; Future  Pure hyperglyceridemia- she is due for fasting lipid panel. -     Lipid panel; Future  Diabetic neuropathy, painful ([facility]) -     gabapentin (NEURONTIN) 300 MG capsule; Take 1 capsule (300 mg total) by mouth 2 (two) times daily.  Visit for screening mammogram  TOBACCO ABUSE -     Ambulatory Referral for Lung Cancer Scre  Transient cerebral ischemia, unspecified type  Cervical cancer screening -     Ambulatory referral to Gynecology   I have discontinued Ms. Eckardt's canagliflozin, Azilsartan Medoxomil, doxycycline, and mupirocin cream. I have also changed her TRULICITY to Dulaglutide. Additionally, I am having her start on telmisartan and aspirin EC. Lastly, I am having her maintain her cyanocobalamin, gabapentin, and rosuvastatin.  Meds ordered this encounter  Medications  . gabapentin (NEURONTIN) 300 MG capsule    Sig: Take 1  capsule (300 mg total) by mouth 2 (two) times daily.    Dispense:  60 capsule    Refill:  5  . rosuvastatin (CRESTOR) 20 MG tablet    Sig: Take 1 tablet (20 mg total) by mouth daily.    Dispense:  90 tablet    Refill:  3  . telmisartan (MICARDIS) 80 MG tablet    Sig: Take 1 tablet (  80 mg total) by mouth daily.    Dispense:  90 tablet    Refill:  1  . Dulaglutide (TRULICITY) A999333 0000000 SOPN    Sig: Inject 1 Act into the skin once a week.    Dispense:  4 pen    Refill:  3  . aspirin EC 81 MG tablet    Sig: Take 1 tablet (81 mg total) by mouth daily.    Dispense:  90 tablet    Refill:  3     Follow-up: Return in about 4 months (around 05/12/2017).  [name], MD

## 2017-01-12 NOTE — Patient Instructions (Signed)

## 2017-01-16 ENCOUNTER — Telehealth: Payer: Self-pay

## 2017-01-16 NOTE — Telephone Encounter (Signed)
Order [id number]

## 2017-01-21 NOTE — Assessment & Plan Note (Signed)
She refused vaccines today I have ordered a mammogram She wants to see GYN for her Pap smear Labs ordered and will be reviewed

## 2017-02-05 NOTE — Telephone Encounter (Signed)
Kit delivered.

## 2017-02-12 ENCOUNTER — Encounter (HOSPITAL_COMMUNITY): Payer: Self-pay | Admitting: Emergency Medicine

## 2017-02-12 ENCOUNTER — Emergency Department (HOSPITAL_COMMUNITY): Payer: Managed Care, Other (non HMO)

## 2017-02-12 ENCOUNTER — Observation Stay (HOSPITAL_BASED_OUTPATIENT_CLINIC_OR_DEPARTMENT_OTHER): Payer: Managed Care, Other (non HMO)

## 2017-02-12 ENCOUNTER — Observation Stay (HOSPITAL_COMMUNITY): Payer: Managed Care, Other (non HMO)

## 2017-02-12 ENCOUNTER — Observation Stay (HOSPITAL_COMMUNITY)
Admission: EM | Admit: 2017-02-12 | Discharge: 2017-02-13 | Disposition: A | Discharge status: Home / Self Care | Payer: Managed Care, Other (non HMO) | Attending: Family Medicine | Admitting: Family Medicine

## 2017-02-12 DIAGNOSIS — Z8673 Personal history of transient ischemic attack (TIA), and cerebral infarction without residual deficits: Secondary | ICD-10-CM | POA: Insufficient documentation

## 2017-02-12 DIAGNOSIS — F17200 Nicotine dependence, unspecified, uncomplicated: Secondary | ICD-10-CM | POA: Insufficient documentation

## 2017-02-12 DIAGNOSIS — Z882 Allergy status to sulfonamides status: Secondary | ICD-10-CM | POA: Diagnosis not present

## 2017-02-12 DIAGNOSIS — E785 Hyperlipidemia, unspecified: Secondary | ICD-10-CM | POA: Diagnosis not present

## 2017-02-12 DIAGNOSIS — R51 Headache: Secondary | ICD-10-CM

## 2017-02-12 DIAGNOSIS — M50322 Other cervical disc degeneration at C5-C6 level: Secondary | ICD-10-CM | POA: Diagnosis not present

## 2017-02-12 DIAGNOSIS — I672 Cerebral atherosclerosis: Secondary | ICD-10-CM | POA: Insufficient documentation

## 2017-02-12 DIAGNOSIS — Z7984 Long term (current) use of oral hypoglycemic drugs: Secondary | ICD-10-CM | POA: Diagnosis not present

## 2017-02-12 DIAGNOSIS — D696 Thrombocytopenia, unspecified: Secondary | ICD-10-CM | POA: Diagnosis not present

## 2017-02-12 DIAGNOSIS — R55 Syncope and collapse: Secondary | ICD-10-CM | POA: Insufficient documentation

## 2017-02-12 DIAGNOSIS — Z7982 Long term (current) use of aspirin: Secondary | ICD-10-CM | POA: Insufficient documentation

## 2017-02-12 DIAGNOSIS — G459 Transient cerebral ischemic attack, unspecified: Secondary | ICD-10-CM | POA: Diagnosis not present

## 2017-02-12 DIAGNOSIS — R001 Bradycardia, unspecified: Secondary | ICD-10-CM | POA: Insufficient documentation

## 2017-02-12 DIAGNOSIS — I10 Essential (primary) hypertension: Secondary | ICD-10-CM | POA: Diagnosis present

## 2017-02-12 DIAGNOSIS — G458 Other transient cerebral ischemic attacks and related syndromes: Secondary | ICD-10-CM

## 2017-02-12 DIAGNOSIS — R202 Paresthesia of skin: Secondary | ICD-10-CM

## 2017-02-12 DIAGNOSIS — E781 Pure hyperglyceridemia: Secondary | ICD-10-CM | POA: Insufficient documentation

## 2017-02-12 DIAGNOSIS — D519 Vitamin B12 deficiency anemia, unspecified: Secondary | ICD-10-CM | POA: Diagnosis not present

## 2017-02-12 DIAGNOSIS — M542 Cervicalgia: Secondary | ICD-10-CM

## 2017-02-12 DIAGNOSIS — R4789 Other speech disturbances: Secondary | ICD-10-CM

## 2017-02-12 DIAGNOSIS — E1142 Type 2 diabetes mellitus with diabetic polyneuropathy: Secondary | ICD-10-CM | POA: Diagnosis present

## 2017-02-12 DIAGNOSIS — Z888 Allergy status to other drugs, medicaments and biological substances status: Secondary | ICD-10-CM | POA: Insufficient documentation

## 2017-02-12 DIAGNOSIS — R519 Headache, unspecified: Secondary | ICD-10-CM

## 2017-02-12 HISTORY — DX: Vitamin B12 deficiency anemia, unspecified: D51.9

## 2017-02-12 HISTORY — DX: Transient cerebral ischemic attack, unspecified: G45.9

## 2017-02-12 HISTORY — DX: Type 2 diabetes mellitus without complications: E11.9

## 2017-02-12 HISTORY — DX: Iron deficiency anemia, unspecified: D50.9

## 2017-02-12 LAB — RAPID URINE DRUG SCREEN, HOSP PERFORMED
Amphetamines: NOT DETECTED
Barbiturates: NOT DETECTED
Benzodiazepines: NOT DETECTED
COCAINE: NOT DETECTED
OPIATES: NOT DETECTED
Tetrahydrocannabinol: NOT DETECTED

## 2017-02-12 LAB — GLUCOSE, CAPILLARY
GLUCOSE-CAPILLARY: 144 mg/dL — AB (ref 65–99)
GLUCOSE-CAPILLARY: 127 mg/dL — AB (ref 65–99)
GLUCOSE-CAPILLARY: 114 mg/dL — AB (ref 65–99)
Glucose-Capillary: 118 mg/dL — ABNORMAL HIGH (ref 65–99)

## 2017-02-12 LAB — LIPID PANEL
CHOLESTEROL: 224 mg/dL — AB (ref 0–200)
HDL: 53 mg/dL (ref >40)
LDL Cholesterol: 146 mg/dL — ABNORMAL HIGH (ref 0–99)
TRIGLYCERIDES: 124 mg/dL (ref <150)
Total CHOL/HDL Ratio: 4.2 RATIO
VLDL: 25 mg/dL (ref 0–40)

## 2017-02-12 LAB — ECHOCARDIOGRAM COMPLETE
Height: 66 in
WEIGHTICAEL: 2720 [oz_av]

## 2017-02-12 LAB — COMPREHENSIVE METABOLIC PANEL
ALT: 21 U/L (ref 14–54)
AST: 30 U/L (ref 15–41)
Albumin: 4.1 g/dL (ref 3.5–5.0)
Alkaline Phosphatase: 60 U/L (ref 38–126)
Anion gap: 7 (ref 5–15)
BILIRUBIN TOTAL: 0.6 mg/dL (ref 0.3–1.2)
BUN: 18 mg/dL (ref 6–20)
CHLORIDE: 105 mmol/L (ref 101–111)
CO2: 29 mmol/L (ref 22–32)
Calcium: 9.2 mg/dL (ref 8.9–10.3)
Creatinine, Ser: 0.92 mg/dL (ref 0.44–1.00)
Glucose, Bld: 142 mg/dL — ABNORMAL HIGH (ref 65–99)
POTASSIUM: 3.8 mmol/L (ref 3.5–5.1)
Sodium: 141 mmol/L (ref 135–145)
TOTAL PROTEIN: 7.3 g/dL (ref 6.5–8.1)

## 2017-02-12 LAB — URINALYSIS, ROUTINE W REFLEX MICROSCOPIC
BILIRUBIN URINE: NEGATIVE
Glucose, UA: NEGATIVE mg/dL
Ketones, ur: NEGATIVE mg/dL
Nitrite: POSITIVE — AB
Protein, ur: NEGATIVE mg/dL
Specific Gravity, Urine: 1.017 (ref 1.005–1.030)
pH: 5 (ref 5.0–8.0)

## 2017-02-12 LAB — CBC
HCT: 42.7 % (ref 36.0–46.0)
HEMOGLOBIN: 14.5 g/dL (ref 12.0–15.0)
MCH: 31.2 pg (ref 26.0–34.0)
MCHC: 34 g/dL (ref 30.0–36.0)
MCV: 91.8 fL (ref 78.0–100.0)
PLATELETS: 149 10*3/uL — AB (ref 150–400)
RBC: 4.65 MIL/uL (ref 3.87–5.11)
RDW: 13.8 % (ref 11.5–15.5)
WBC: 7.3 10*3/uL (ref 4.0–10.5)

## 2017-02-12 LAB — I-STAT CHEM 8, ED
BUN: 18 mg/dL (ref 6–20)
CALCIUM ION: 1.11 mmol/L — AB (ref 1.15–1.40)
CHLORIDE: 102 mmol/L (ref 101–111)
Creatinine, Ser: 1 mg/dL (ref 0.44–1.00)
Glucose, Bld: 135 mg/dL — ABNORMAL HIGH (ref 65–99)
HCT: 42 % (ref 36.0–46.0)
HEMOGLOBIN: 14.3 g/dL (ref 12.0–15.0)
Potassium: 3.6 mmol/L (ref 3.5–5.1)
SODIUM: 142 mmol/L (ref 135–145)
TCO2: 30 mmol/L (ref 0–100)

## 2017-02-12 LAB — PROTIME-INR
INR: 1.01
PROTHROMBIN TIME: 13.3 s (ref 11.4–15.2)

## 2017-02-12 LAB — VAS US CAROTID
LCCAPSYS: 85 cm/s
LEFT ECA DIAS: -19 cm/s
LEFT VERTEBRAL DIAS: 14 cm/s
LICADDIAS: -34 cm/s
LICAPSYS: 85 cm/s
Left CCA dist dias: 27 cm/s
Left CCA dist sys: 78 cm/s
Left CCA prox dias: 21 cm/s
Left ICA dist sys: -84 cm/s
Left ICA prox dias: 27 cm/s
RIGHT ECA DIAS: -23 cm/s
RIGHT VERTEBRAL DIAS: 17 cm/s
Right CCA prox dias: 13 cm/s
Right CCA prox sys: 52 cm/s
Right cca dist sys: -101 cm/s

## 2017-02-12 LAB — DIFFERENTIAL
BASOS ABS: 0 10*3/uL (ref 0.0–0.1)
BASOS PCT: 0 %
EOS ABS: 0.1 10*3/uL (ref 0.0–0.7)
EOS PCT: 2 %
Lymphocytes Relative: 39 %
Lymphs Abs: 2.9 10*3/uL (ref 0.7–4.0)
MONO ABS: 0.3 10*3/uL (ref 0.1–1.0)
MONOS PCT: 5 %
Neutro Abs: 4 10*3/uL (ref 1.7–7.7)
Neutrophils Relative %: 54 %

## 2017-02-12 LAB — I-STAT TROPONIN, ED: TROPONIN I, POC: 0 ng/mL (ref 0.00–0.08)

## 2017-02-12 LAB — APTT: aPTT: 27 seconds (ref 24–36)

## 2017-02-12 LAB — CBG MONITORING, ED: GLUCOSE-CAPILLARY: 134 mg/dL — AB (ref 65–99)

## 2017-02-12 MED ORDER — PROCHLORPERAZINE EDISYLATE 5 MG/ML IJ SOLN
10.0000 mg | Freq: Once | INTRAMUSCULAR | Status: AC
  Administered 2017-02-12: 10 mg via INTRAVENOUS
  Filled 2017-02-12: qty 2

## 2017-02-12 MED ORDER — SODIUM CHLORIDE 0.9 % IJ SOLN
INTRAMUSCULAR | Status: AC
  Filled 2017-02-12: qty 50

## 2017-02-12 MED ORDER — LORAZEPAM 2 MG/ML IJ SOLN
1.0000 mg | Freq: Once | INTRAMUSCULAR | Status: AC
  Administered 2017-02-12: 1 mg via INTRAVENOUS
  Filled 2017-02-12: qty 1

## 2017-02-12 MED ORDER — INSULIN ASPART 100 UNIT/ML ~~LOC~~ SOLN
0.0000 [IU] | Freq: Three times a day (TID) | SUBCUTANEOUS | Status: DC
  Administered 2017-02-12 – 2017-02-13 (×3): 2 [IU] via SUBCUTANEOUS

## 2017-02-12 MED ORDER — IRBESARTAN 300 MG PO TABS
300.0000 mg | ORAL_TABLET | Freq: Every day | ORAL | Status: DC
  Administered 2017-02-12 – 2017-02-13 (×2): 300 mg via ORAL
  Filled 2017-02-12 (×2): qty 1

## 2017-02-12 MED ORDER — IOPAMIDOL (ISOVUE-370) INJECTION 76%
INTRAVENOUS | Status: AC
  Administered 2017-02-12: 100 mL via INTRAVENOUS
  Filled 2017-02-12: qty 100

## 2017-02-12 MED ORDER — LORAZEPAM 2 MG/ML IJ SOLN
INTRAMUSCULAR | Status: AC
  Filled 2017-02-12: qty 1

## 2017-02-12 MED ORDER — PERFLUTREN LIPID MICROSPHERE
1.0000 mL | INTRAVENOUS | Status: AC | PRN
  Administered 2017-02-12: 2 mL via INTRAVENOUS
  Filled 2017-02-12: qty 10

## 2017-02-12 MED ORDER — GABAPENTIN 300 MG PO CAPS
300.0000 mg | ORAL_CAPSULE | Freq: Two times a day (BID) | ORAL | Status: DC
  Administered 2017-02-12 – 2017-02-13 (×3): 300 mg via ORAL
  Filled 2017-02-12 (×3): qty 1

## 2017-02-12 MED ORDER — SODIUM CHLORIDE 0.9 % IV BOLUS (SEPSIS)
1000.0000 mL | Freq: Once | INTRAVENOUS | Status: AC
  Administered 2017-02-12: 1000 mL via INTRAVENOUS

## 2017-02-12 MED ORDER — ACETAMINOPHEN 325 MG PO TABS: 650.0000 mg | ORAL_TABLET | ORAL | Status: DC | PRN

## 2017-02-12 MED ORDER — STROKE: EARLY STAGES OF RECOVERY BOOK
Freq: Once | Status: AC
  Administered 2017-02-12: 13:00:00
  Filled 2017-02-12: qty 1

## 2017-02-12 MED ORDER — ASPIRIN 300 MG RE SUPP: 300.0000 mg | Freq: Every day | RECTAL | Status: DC

## 2017-02-12 MED ORDER — ENOXAPARIN SODIUM 40 MG/0.4ML ~~LOC~~ SOLN
40.0000 mg | SUBCUTANEOUS | Status: DC
  Administered 2017-02-12 – 2017-02-13 (×2): 40 mg via SUBCUTANEOUS
  Filled 2017-02-12 (×2): qty 0.4

## 2017-02-12 MED ORDER — ASPIRIN 325 MG PO TABS
325.0000 mg | ORAL_TABLET | Freq: Every day | ORAL | Status: DC
  Administered 2017-02-12 – 2017-02-13 (×2): 325 mg via ORAL
  Filled 2017-02-12 (×2): qty 1

## 2017-02-12 MED ORDER — ACETAMINOPHEN 160 MG/5ML PO SOLN: 650.0000 mg | ORAL | Status: DC | PRN

## 2017-02-12 MED ORDER — INSULIN ASPART 100 UNIT/ML ~~LOC~~ SOLN: 0.0000 [IU] | Freq: Every day | SUBCUTANEOUS | Status: DC

## 2017-02-12 MED ORDER — GADOBENATE DIMEGLUMINE 529 MG/ML IV SOLN
15.0000 mL | Freq: Once | INTRAVENOUS | Status: AC | PRN
  Administered 2017-02-12: 15 mL via INTRAVENOUS

## 2017-02-12 MED ORDER — ONDANSETRON HCL 4 MG/2ML IJ SOLN
INTRAMUSCULAR | Status: AC
  Administered 2017-02-12: 03:00:00
  Filled 2017-02-12: qty 2

## 2017-02-12 MED ORDER — ROSUVASTATIN CALCIUM 20 MG PO TABS
20.0000 mg | ORAL_TABLET | Freq: Every day | ORAL | Status: DC
  Administered 2017-02-12 – 2017-02-13 (×2): 20 mg via ORAL
  Filled 2017-02-12 (×2): qty 1

## 2017-02-12 MED ORDER — IOPAMIDOL (ISOVUE-370) INJECTION 76%
100.0000 mL | Freq: Once | INTRAVENOUS | Status: AC | PRN
  Administered 2017-02-12: 100 mL via INTRAVENOUS

## 2017-02-12 MED ORDER — DEXTROSE 5 % IV SOLN
1.0000 g | INTRAVENOUS | Status: DC
  Administered 2017-02-12: 1 g via INTRAVENOUS
  Filled 2017-02-12 (×2): qty 10

## 2017-02-12 MED ORDER — ACETAMINOPHEN 650 MG RE SUPP: 650.0000 mg | RECTAL | Status: DC | PRN

## 2017-02-12 MED ORDER — DIPHENHYDRAMINE HCL 50 MG/ML IJ SOLN
25.0000 mg | Freq: Once | INTRAMUSCULAR | Status: AC
  Administered 2017-02-12: 25 mg via INTRAVENOUS
  Filled 2017-02-12: qty 1

## 2017-02-12 NOTE — Progress Notes (Signed)
*  PRELIMINARY RESULTS* Vascular Ultrasound Carotid Duplex (Doppler) has been completed.  Preliminary findings: Bilateral: No significant (1-39%) ICA stenosis. Antegrade vertebral flow.    Landry Mellow, RDMS, RVT  02/12/2017, 10:10 AM

## 2017-02-12 NOTE — Progress Notes (Signed)
Lab contacted RN stating they received urine sample, but do not have order in computer. Lab asked RN to re-enter orders in attempt to process urine sample.

## 2017-02-12 NOTE — Progress Notes (Signed)
OT Cancellation Note  Patient Details Name: Madeline West MRN: [id number] DOB: 01-25-1961   Cancelled Treatment:    Reason Eval/Treat Not Completed: Patient at procedure or test/ unavailable (MRI). 2nd attempt at OT eval today. Will follow up as time allows.  Binnie Kand M.S., OTR/L Pager: [phone]  02/12/2017, 4:07 PM

## 2017-02-12 NOTE — Progress Notes (Signed)
MRI called to bring patient down. MRI made aware patient is in ECHO and they stated they will coordinate to transport patient from ECHO to MRI before transport back to floor.

## 2017-02-12 NOTE — Progress Notes (Signed)
OT Cancellation Note  Patient Details Name: Madeline West MRN: [id number] DOB: Nov 22, 1961   Cancelled Treatment:    Reason Eval/Treat Not Completed: Fatigue/lethargy limiting ability to participate (per RN, pt just received Ativan). RN requesting therapy check back this afternoon. Will follow up for OT eval as time allows.  Binnie Kand M.S., OTR/L Pager: [phone]  02/12/2017, 8:25 AM

## 2017-02-12 NOTE — Progress Notes (Signed)
  Echocardiogram 2D Echocardiogram with Definity has been performed.  Diamond Nickel 02/12/2017, 10:32 AM

## 2017-02-12 NOTE — ED Provider Notes (Signed)
[facility] DEPT Provider Note   CSN: [id number] Arrival date & time: 02/12/17  0019  By signing my name below, I, Jeanell Sparrow, attest that this documentation has been prepared under the direction and in the presence of Merryl Hacker, MD. Electronically Signed: Jeanell Sparrow, Scribe. 02/12/2017. 1:08 AM.  History   Chief Complaint Chief Complaint  Patient presents with  . Headache  . Arm Pain   The history is provided by the patient. No language interpreter was used.   HPI Comments: Madeline West is a 56 y.o. female with a PMHx of HTN and TIA who presents to the Emergency Department complaining of constant moderate right-sided neck pain that started about 3 hours ago. She had pain onset on the way home from a restaurant. She describes the pain as a burning/sharp sensation that radiates to her right shoulder, right ear, and right eye.  ALso reports headache.  Pain is 5/10.  She reports associated diaphoresis, chest pain (substernal), headache (frontal), RUE numbness/tingling, and slowed speech. She reports word finding difficulty prior to arrival. States that she still feels like she has some difficulty. She admits to a hx of smoking. She denies any hx of migraines or other complaints.   PCP: [name], MD  Past Medical History:  Diagnosis Date  . Anemia   . Diabetes ([place])   . Hyperlipidemia   . Hypertension     Patient Active Problem List   Diagnosis Date Noted  . Cervical cancer screening 01/12/2017  . Type 2 diabetes mellitus without complication, without long-term current use of insulin ([place]) 03/06/2016  . Pure hyperglyceridemia 01/30/2016  . Memory loss 01/30/2016  . Screen for colon cancer 08/29/2015  . Diabetic neuropathy, painful ([place]) 08/10/2014  . Routine general medical examination at a health care facility 08/10/2014  . TIA (transient ischemic attack) 03/28/2014  . Hyperlipidemia with target LDL less than 100 03/27/2014  . Lumbar disc disease with  radiculopathy 01/25/2013  . Folate deficiency anemia 06/12/2012  . Essential hypertension, benign 06/11/2012  . Visit for screening mammogram 06/11/2012  . Other vitamin B12 deficiency anemia 09/20/2008  . Iron deficiency anemia 09/06/2008  . TOBACCO ABUSE 09/06/2008    Past Surgical History:  Procedure Laterality Date  . CESAREAN SECTION     x 3  . ORIF ELBOW FRACTURE Left   . TUBAL LIGATION      OB History    No data available       Home Medications    Prior to Admission medications   Medication Sig Start Date End Date Taking? Authorizing Provider  aspirin EC 81 MG tablet Take 1 tablet (81 mg total) by mouth daily. 01/12/17  Yes Janith Lima, MD  cyanocobalamin (,VITAMIN B-12,) 1000 MCG/ML injection INJECT 1ML INTO THE MUSCLE EVERY 14 DAYS 12/29/16  Yes Janith Lima, MD  Dulaglutide (TRULICITY) A999333 [id number] SOPN Inject 1 Act into the skin once a week. 01/12/17  Yes Janith Lima, MD  gabapentin (NEURONTIN) 300 MG capsule Take 1 capsule (300 mg total) by mouth 2 (two) times daily. 01/12/17  Yes Janith Lima, MD  rosuvastatin (CRESTOR) 20 MG tablet Take 1 tablet (20 mg total) by mouth daily. 01/12/17  Yes Janith Lima, MD  telmisartan (MICARDIS) 80 MG tablet Take 1 tablet (80 mg total) by mouth daily. Patient not taking: Reported on 02/12/2017 01/12/17   Janith Lima, MD    Family History Family History  Problem Relation Age of Onset  . Alcohol  abuse Other   . Drug abuse Other   . Diabetes Other   . Hyperlipidemia Other   . Cancer Other     Breast and Lung Cancer    Social History Social History  Substance Use Topics  . Smoking status: Current Every Day Smoker    Years: 35.00  . Smokeless tobacco: Never Used  . Alcohol use 0.0 oz/week     Comment: 2-3 times week     Allergies   Lyrica [pregabalin]; Metformin and related; Sulfa antibiotics; and Sulfacetamide sodium   Review of Systems Review of Systems  Constitutional: Negative for fever.  HENT:  Positive for ear pain (Right).   Respiratory: Negative for shortness of breath.   Cardiovascular: Positive for chest pain (Substernal).  Gastrointestinal: Negative for abdominal pain, nausea and vomiting.  Musculoskeletal: Positive for myalgias (Right shoulder) and neck pain (Right-sided).  Neurological: Positive for headaches (Frontal).  All other systems reviewed and are negative.    Physical Exam Updated Vital Signs BP 162/91 (BP Location: Left Arm)   Pulse 74   Temp 98 F (36.7 C) (Oral)   Resp 16   Ht 5\' 6"  (1.676 m)   Wt 170 lb (77.1 kg)   LMP 02/17/2013   SpO2 95%   BMI 27.44 kg/m   Physical Exam  Constitutional: She is oriented to person, place, and time. She appears well-developed and well-nourished. No distress.  HENT:  Head: Normocephalic and atraumatic.  Eyes: Pupils are equal, round, and reactive to light.  Neck: Normal range of motion. Neck supple.  Cardiovascular: Normal rate, regular rhythm and normal heart sounds.   Pulmonary/Chest: Effort normal and breath sounds normal. No respiratory distress. She has no wheezes.  Abdominal: Soft. Bowel sounds are normal. There is no tenderness.  Lymphadenopathy:    She has no cervical adenopathy.  Neurological: She is alert and oriented to person, place, and time.  Fluent speech, cranial nerves II through XII intact, no drift, 5 out of 5 strength in all 4 extremities, no dysmetria to finger-nose-finger  Skin: Skin is warm and dry.  Psychiatric: She has a normal mood and affect.  Nursing note and vitals reviewed.    ED Treatments / Results  DIAGNOSTIC STUDIES: Oxygen Saturation is 95% on RA, normal by my interpretation.    COORDINATION OF CARE: 1:12 AM- Pt advised of plan for treatment and pt agrees.  Labs (all labs ordered are listed, but only abnormal results are displayed) Labs Reviewed  COMPREHENSIVE METABOLIC PANEL - Abnormal; Notable for the following:       Result Value   Glucose, Bld 142 (*)    All  other components within normal limits  CBC - Abnormal; Notable for the following:    Platelets 149 (*)    All other components within normal limits  I-STAT CHEM 8, ED - Abnormal; Notable for the following:    Glucose, Bld 135 (*)    Calcium, Ion 1.11 (*)    All other components within normal limits  PROTIME-INR  APTT  DIFFERENTIAL  URINALYSIS, ROUTINE W REFLEX MICROSCOPIC  ETHANOL  RAPID URINE DRUG SCREEN, HOSP PERFORMED  CBG MONITORING, ED  I-STAT TROPOININ, ED    EKG  EKG Interpretation  Date/Time:  Thursday February 12 2017 01:00:26 EST Ventricular Rate:  70 PR Interval:    QRS Duration: 93 QT Interval:  430 QTC Calculation: 464 R Axis:   67 Text Interpretation:  Sinus rhythm ST elev, probable normal early repol pattern Confirmed by HORTON  MD,  COURTNEY ([id number]) on 02/12/2017 2:35:48 AM       Radiology Ct Head Wo Contrast  Result Date: 02/12/2017 CLINICAL DATA:  Frontal headache, RIGHT upper extremity numbness and slurred speech. RIGHT neck pain radiating to RIGHT eye and shoulder. History of hypertension, diabetes, hyperlipidemia and TIA. EXAM: CT HEAD WITHOUT CONTRAST TECHNIQUE: Contiguous axial images were obtained from the base of the skull through the vertex without intravenous contrast. COMPARISON:  None. FINDINGS: BRAIN: No intraparenchymal hemorrhage, mass effect nor midline shift. No acute large vascular territory infarcts. Old small LEFT basal ganglia lacunar infarct with mild ex vacuo dilatation subjacent ventricle, ventricles and sulci are otherwise normal for patient's No abnormal extra-axial fluid collections. Basal cisterns are patent. VASCULAR: Mild calcific atherosclerosis the carotid siphons. SKULL/SOFT TISSUES: No skull fracture. No significant soft tissue swelling. ORBITS/SINUSES: The included ocular globes and orbital contents are normal.The mastoid aircells and included paranasal sinuses are well-aerated. OTHER: None. IMPRESSION: No acute intracranial  process. Old LEFT basal ganglia lacunar infarct. Electronically Signed   By: Elon Alas M.D.   On: 02/12/2017 02:30    Procedures Procedures (including critical care time)  Medications Ordered in ED Medications  sodium chloride 0.9 % bolus 1,000 mL (1,000 mLs Intravenous New Bag/Given 02/12/17 0253)  prochlorperazine (COMPAZINE) injection 10 mg (10 mg Intravenous Given 02/12/17 0250)  diphenhydrAMINE (BENADRYL) injection 25 mg (25 mg Intravenous Given 02/12/17 0250)  ondansetron (ZOFRAN) 4 MG/2ML injection (  Given 02/12/17 0254)     Initial Impression / Assessment and Plan / ED Course  I have reviewed the triage vital signs and the nursing notes.  Pertinent labs & imaging results that were available during my care of the patient were reviewed by me and considered in my medical decision making (see chart for details).     Patient presents with headache, neck pain, right upper extremity paresthesias, or finding difficulty, and chest pain. Vital signs initially reassuring. Her neuro exam is intact at this time. She does have risk factors for both ACS and TIA. Other considerations include complicated migraine given her pain.  Patient was given migraine cocktail. Stroke workup was initiated including a CT. Patient did have some improvement after treatment with a migraine cocktail. Of note, with IV placement she had an episode of syncope. It appeared vasovagal in nature. Upon wakening up, patient was awake, alert, and oriented. I discussed the patient with Dr. Leonel Ramsay. Will plan for admission for TIA workup. He recommends CTA head and neck to evaluate for carotid occlusions given neck pain. This has been ordered. Discussed with Dr. Loleta Books. EMTALA placed for transfer to [facility].  Final Clinical Impressions(s) / ED Diagnoses   Final diagnoses:  Word finding difficulty  Acute nonintractable headache, unspecified headache type  Syncope, unspecified syncope type    New Prescriptions New  Prescriptions   No medications on file   I personally performed the services described in this documentation, which was scribed in my presence. The recorded information has been reviewed and is accurate.     Merryl Hacker, MD 02/12/17 [phone]

## 2017-02-12 NOTE — Consult Note (Signed)
NEURO HOSPITALIST CONSULT NOTE   Requestig physician: Dr. Algis Liming   Reason for Consult: right arm pain and numbness   History obtained from:  Patient     HPI:                                                                                                                                          Madeline West is an 56 y.o. female who has a history of hypertension hyperlipidemia and diabetes. Patient admits that she's been working out at the gym more often lately and has noted that she's even when using weights more often than usual. She states that she was at her local store around 9 PM when she suddenly noticed right neck discomfort right shoulder discomfort and discomfort that radiated down her lateral arm into the top of her hand. She also has numbness and possibly some weakness in that shoulder. Patient went home and laid down and noted that the symptoms had resolved. Currently she has no discomfort or symptoms.  Some of the discomfort is reproducible with palpation. Neurology was asked to see for possible TIA. MRI of the brain was obtained and showed no intracranial abnormalities. CT angiogram head and neck also was obtained and showed no abnormalities.  Past Medical History:  Diagnosis Date  . Anemia   . Diabetes ([place])   . Hyperlipidemia   . Hypertension   . TIA (transient ischemic attack)     Past Surgical History:  Procedure Laterality Date  . CESAREAN SECTION     x 3  . ORIF ELBOW FRACTURE Left   . TUBAL LIGATION      Family History  Problem Relation Age of Onset  . Alcohol abuse Other   . Drug abuse Other   . Diabetes Other   . Hyperlipidemia Other   . Cancer Other     Breast and Lung Cancer  . Hypertension Mother      Social History:  reports that she has been smoking.  She has smoked for the past 35.00 years. She has never used smokeless tobacco. She reports that she drinks alcohol. She reports that she does not use drugs.  Allergies   Allergen Reactions  . Lyrica [Pregabalin] Swelling    Felt like tongue swelling at night  . Metformin And Related     diarrhea  . Sulfa Antibiotics   . Sulfacetamide Sodium     MEDICATIONS:  Prior to Admission:  Prescriptions Prior to Admission  Medication Sig Dispense Refill Last Dose  . aspirin EC 81 MG tablet Take 1 tablet (81 mg total) by mouth daily. 90 tablet 3 02/11/2017 at Unknown time  . cyanocobalamin (,VITAMIN B-12,) 1000 MCG/ML injection INJECT 1ML INTO THE MUSCLE EVERY 14 DAYS 10 mL 1 Past Month at Unknown time  . Dulaglutide (TRULICITY) [id number] SOPN Inject 1 Act into the skin once a week. 4 pen 3 Past Month at Unknown time  . gabapentin (NEURONTIN) 300 MG capsule Take 1 capsule (300 mg total) by mouth 2 (two) times daily. 60 capsule 5 02/11/2017 at Unknown time  . rosuvastatin (CRESTOR) 20 MG tablet Take 1 tablet (20 mg total) by mouth daily. 90 tablet 3 02/11/2017 at Unknown time  . telmisartan (MICARDIS) 80 MG tablet Take 1 tablet (80 mg total) by mouth daily. (Patient not taking: Reported on 02/12/2017) 90 tablet 1 Not Taking at Unknown time   Scheduled: . aspirin  300 mg Rectal Daily   Or  . aspirin  325 mg Oral Daily  . enoxaparin (LOVENOX) injection  40 mg Subcutaneous Q24H  . gabapentin  300 mg Oral BID  . insulin aspart  0-15 Units Subcutaneous TID WC  . insulin aspart  0-5 Units Subcutaneous QHS  . irbesartan  300 mg Oral Daily  . LORazepam      . rosuvastatin  20 mg Oral Daily  . sodium chloride         ROS:                                                                                                                                       History obtained from the patient  General ROS: negative for - chills, fatigue, fever, night sweats, weight gain or weight loss Psychological ROS: negative for - behavioral disorder, hallucinations,  memory difficulties, mood swings or suicidal ideation Ophthalmic ROS: negative for - blurry vision, double vision, eye pain or loss of vision ENT ROS: negative for - epistaxis, nasal discharge, oral lesions, sore throat, tinnitus or vertigo Allergy and Immunology ROS: negative for - hives or itchy/watery eyes Hematological and Lymphatic ROS: negative for - bleeding problems, bruising or swollen lymph nodes Endocrine ROS: negative for - galactorrhea, hair pattern changes, polydipsia/polyuria or temperature intolerance Respiratory ROS: negative for - cough, hemoptysis, shortness of breath or wheezing Cardiovascular ROS: negative for - chest pain, dyspnea on exertion, edema or irregular heartbeat Gastrointestinal ROS: negative for - abdominal pain, diarrhea, hematemesis, nausea/vomiting or stool incontinence Genito-Urinary ROS: negative for - dysuria, hematuria, incontinence or urinary frequency/urgency Musculoskeletal ROS: negative for - joint swelling or muscular weakness Neurological ROS: as noted in HPI Dermatological ROS: negative for rash and skin lesion changes   Blood pressure (!) 149/81, pulse 90, temperature 98.3 F (36.8 C), temperature source Oral, resp. rate 18, height 5\' 6"  (1.676 m), weight 77.1 kg (  170 lb), last menstrual period 02/17/2013, SpO2 98 %.   Neurologic Examination:                                                                                                      HEENT-  Normocephalic, no lesions, without obvious abnormality.  Normal external eye and conjunctiva.  Normal TM's bilaterally.  Normal auditory canals and external ears. Normal external nose, mucus membranes and septum.  Normal pharynx. Cardiovascular- S1, S2 normal, pulses palpable throughout   Lungs- chest clear, no wheezing, rales, normal symmetric air entry Abdomen- soft, non-tender; bowel sounds normal; no masses,  no organomegaly Extremities- no edema Lymph-no adenopathy palpable Musculoskeletal-no  joint tenderness, deformity or swelling Skin-warm and dry, no hyperpigmentation, vitiligo, or suspicious lesions  Neurological Examination Mental Status: Alert, oriented, thought content appropriate.  Speech fluent without evidence of aphasia.  Able to follow 3 step commands without difficulty. Cranial Nerves: II: Visual fields grossly normal, pupils equal, round, reactive to light and accommodation III,IV, VI: ptosis not present, extra-ocular motions intact bilaterally V,VII: smile symmetric, facial light touch sensation normal bilaterally VIII: hearing normal bilaterally IX,X: uvula rises symmetrically XI: bilateral shoulder shrug XII: midline tongue extension Motor: Right : Upper extremity   5/5    Left:     Upper extremity   5/5  Lower extremity   5/5     Lower extremity   5/5 --Patient has a negative speed's test --Patient has a positive Yergason's test --Patient has a positive pushoff test --Patient has positive discomfort to palpation of the acromioclavicular joint and the bicipital groove. Sensory: Pinprick and light touch intact throughout, bilaterally Deep Tendon Reflexes: 2+ and symmetric throughout Plantars: Right: downgoing   Left: downgoing Cerebellar: normal finger-to-nose, and normal heel-to-shin test Gait: Not tested      Lab Results: Basic Metabolic Panel:  Recent Labs Lab 02/12/17 0241 02/12/17 0250  NA 141 142  K 3.8 3.6  CL 105 102  CO2 29  --   GLUCOSE 142* 135*  BUN 18 18  CREATININE 0.92 1.00  CALCIUM 9.2  --     Liver Function Tests:  Recent Labs Lab 02/12/17 0241  AST 30  ALT 21  ALKPHOS 60  BILITOT 0.6  PROT 7.3  ALBUMIN 4.1   No results for input(s): LIPASE, AMYLASE in the last 168 hours. No results for input(s): AMMONIA in the last 168 hours.  CBC:  Recent Labs Lab 02/12/17 0241 02/12/17 0250  WBC 7.3  --   NEUTROABS 4.0  --   HGB 14.5 14.3  HCT 42.7 42.0  MCV 91.8  --   PLT 149*  --     Cardiac Enzymes: No  results for input(s): CKTOTAL, CKMB, CKMBINDEX, TROPONINI in the last 168 hours.  Lipid Panel:  Recent Labs Lab 02/12/17 0734  CHOL 224*  TRIG 124  HDL 53  CHOLHDL 4.2  VLDL 25  LDLCALC 146*    CBG:  Recent Labs Lab 02/12/17 0115 02/12/17 0804 02/12/17 1239  GLUCAP 134* 144* 127*    Microbiology: No results found for this or  any previous visit.  Coagulation Studies:  Recent Labs  02/12/17 0241  LABPROT 13.3  INR 1.01    Imaging: Ct Angio Head W Or Wo Contrast  Result Date: 02/12/2017 CLINICAL DATA:  Frontal headache radiating to RIGHT eye and RIGHT shoulder. Slowed speech. History of hypertension, diabetes, TIA. EXAM: CT ANGIOGRAPHY HEAD AND NECK TECHNIQUE: Multidetector CT imaging of the head and neck was performed using the standard protocol during bolus administration of intravenous contrast. Multiplanar CT image reconstructions and MIPs were obtained to evaluate the vascular anatomy. Carotid stenosis measurements (when applicable) are obtained utilizing NASCET criteria, using the distal internal carotid diameter as the denominator. CONTRAST:  100 cc Isovue 370 COMPARISON:  CT HEAD February 12, 2017 at 0213 hours. CT angiogram head and neck report dated March 13, 2014 though images are not available for direct comparison. FINDINGS: CTA NECK AORTIC ARCH: Normal appearance of the thoracic arch, normal branch pattern. The origins of the innominate, left Common carotid artery and subclavian artery are widely patent. RIGHT CAROTID SYSTEM: Common carotid artery is widely patent, coursing in a straight line fashion. Moderate intimal thickening and calcific atherosclerosis of the carotid bifurcation without hemodynamically significant stenosis by NASCET criteria. Normal appearance of the included internal carotid artery. LEFT CAROTID SYSTEM: Common carotid artery is widely patent, coursing in a straight line fashion. Moderate intimal thickening and calcific atherosclerosis of the  carotid bifurcation without hemodynamically significant stenosis by NASCET criteria. Normal appearance of the included internal carotid artery. VERTEBRAL ARTERIES:Codominant vertebral arteries. Normal appearance of the vertebral arteries, which appear widely patent. SKELETON: No acute osseous process though bone windows have not been submitted. 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 CTA HEAD ANTERIOR CIRCULATION: Mild calcific atherosclerosis the carotid siphons. Normal appearance of the cervical internal carotid arteries, petrous, cavernous and supra clinoid internal carotid arteries. Widely patent anterior communicating artery. Patent anterior and middle cerebral arteries. Mild stenosis LEFT M2 origin. No large vessel occlusion, hemodynamically significant stenosis, dissection, luminal irregularity, contrast extravasation or aneurysm. POSTERIOR CIRCULATION: Normal appearance of the vertebral arteries, vertebrobasilar junction and basilar artery, as well as main branch vessels. Patent bilateral posterior cerebral arteries mild luminal irregularity compatible with atherosclerosis. No large vessel occlusion, hemodynamically significant stenosis, dissection, contrast extravasation or aneurysm. VENOUS SINUSES: Major dural venous sinuses are patent though not tailored for evaluation on this angiographic examination. ANATOMIC VARIANTS: None. DELAYED PHASE: No abnormal intracranial enhancement. IMPRESSION: CTA NECK: Atherosclerosis without hemodynamically significant stenosis or acute vascular process. CTA HEAD:  No emergent large vessel occlusion or severe stenosis. Mild intracranial atherosclerosis. Electronically Signed   By: Elon Alas M.D.   On: 02/12/2017 06:28   Ct Head Wo Contrast  Result Date: 02/12/2017 CLINICAL DATA:  Frontal headache, RIGHT upper  extremity numbness and slurred speech. RIGHT neck pain radiating to RIGHT eye and shoulder. History of hypertension, diabetes, hyperlipidemia and TIA. EXAM: CT HEAD WITHOUT CONTRAST TECHNIQUE: Contiguous axial images were obtained from the base of the skull through the vertex without intravenous contrast. COMPARISON:  None. FINDINGS: BRAIN: No intraparenchymal hemorrhage, mass effect nor midline shift. No acute large vascular territory infarcts. Old small LEFT basal ganglia lacunar infarct with mild ex vacuo dilatation subjacent ventricle, ventricles and sulci are otherwise normal for patient's No abnormal extra-axial fluid collections. Basal cisterns are patent. VASCULAR: Mild calcific atherosclerosis the carotid siphons. SKULL/SOFT TISSUES: No skull fracture. No significant soft  tissue swelling. ORBITS/SINUSES: The included ocular globes and orbital contents are normal.The mastoid aircells and included paranasal sinuses are well-aerated. OTHER: None. IMPRESSION: No acute intracranial process. Old LEFT basal ganglia lacunar infarct. Electronically Signed   By: Elon Alas M.D.   On: 02/12/2017 02:30   Ct Angio Neck W And/or Wo Contrast  Result Date: 02/12/2017 CLINICAL DATA:  Frontal headache radiating to RIGHT eye and RIGHT shoulder. Slowed speech. History of hypertension, diabetes, TIA. EXAM: CT ANGIOGRAPHY HEAD AND NECK TECHNIQUE: Multidetector CT imaging of the head and neck was performed using the standard protocol during bolus administration of intravenous contrast. Multiplanar CT image reconstructions and MIPs were obtained to evaluate the vascular anatomy. Carotid stenosis measurements (when applicable) are obtained utilizing NASCET criteria, using the distal internal carotid diameter as the denominator. CONTRAST:  100 cc Isovue 370 COMPARISON:  CT HEAD February 12, 2017 at 0213 hours. CT angiogram head and neck report dated March 13, 2014 though images are not available for direct comparison.  FINDINGS: CTA NECK AORTIC ARCH: Normal appearance of the thoracic arch, normal branch pattern. The origins of the innominate, left Common carotid artery and subclavian artery are widely patent. RIGHT CAROTID SYSTEM: Common carotid artery is widely patent, coursing in a straight line fashion. Moderate intimal thickening and calcific atherosclerosis of the carotid bifurcation without hemodynamically significant stenosis by NASCET criteria. Normal appearance of the included internal carotid artery. LEFT CAROTID SYSTEM: Common carotid artery is widely patent, coursing in a straight line fashion. Moderate intimal thickening and calcific atherosclerosis of the carotid bifurcation without hemodynamically significant stenosis by NASCET criteria. Normal appearance of the included internal carotid artery. VERTEBRAL ARTERIES:Codominant vertebral arteries. Normal appearance of the vertebral arteries, which appear widely patent. SKELETON: No acute osseous process though bone windows have not been submitted. 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 CTA HEAD ANTERIOR CIRCULATION: Mild calcific atherosclerosis the carotid siphons. Normal appearance of the cervical internal carotid arteries, petrous, cavernous and supra clinoid internal carotid arteries. Widely patent anterior communicating artery. Patent anterior and middle cerebral arteries. Mild stenosis LEFT M2 origin. No large vessel occlusion, hemodynamically significant stenosis, dissection, luminal irregularity, contrast extravasation or aneurysm. POSTERIOR CIRCULATION: Normal appearance of the vertebral arteries, vertebrobasilar junction and basilar artery, as well as main branch vessels. Patent bilateral posterior cerebral arteries mild luminal irregularity compatible with atherosclerosis. No large vessel occlusion,  hemodynamically significant stenosis, dissection, contrast extravasation or aneurysm. VENOUS SINUSES: Major dural venous sinuses are patent though not tailored for evaluation on this angiographic examination. ANATOMIC VARIANTS: None. DELAYED PHASE: No abnormal intracranial enhancement. IMPRESSION: CTA NECK: Atherosclerosis without hemodynamically significant stenosis or acute vascular process. CTA HEAD:  No emergent large vessel occlusion or severe stenosis. Mild intracranial atherosclerosis. Electronically Signed   By: Elon Alas M.D.   On: 02/12/2017 06:28   Mr Brain Wo Contrast  Result Date: 02/12/2017 CLINICAL DATA:  TIA EXAM: MRI HEAD WITHOUT CONTRAST TECHNIQUE: Multiplanar, multiecho pulse sequences of the brain and surrounding structures were obtained without intravenous contrast. COMPARISON:  CT head 02/12/2017 FINDINGS: Brain: Negative for acute infarct. Mild chronic microvascular ischemic change most notably in the left frontal periventricular white matter. Ventricle size and cerebral volume normal. Negative for hemorrhage or mass. No edema or shift of the midline structures. Pituitary normal in size Vascular: Normal arterial flow voids. Skull and upper cervical spine: Negative Sinuses/Orbits: Mild mucosal edema paranasal  sinuses.  Normal orbit. Other: None IMPRESSION: Negative for acute infarct. Mild chronic ischemia left frontal white matter. Electronically Signed   By: Franchot Gallo M.D.   On: 02/12/2017 12:52       Assessment and plan per attending neurologist  Etta Quill PA-C [facility] [phone]  02/12/2017, 2:27 PM   Assessment/Plan: This 56 year old female with a transient pain of her right neck right shoulder that radiated down the lateral aspect of her right arm into the top of her hand. At this time. Differential diagnosis includes less likely TIA but more likely rotator cuff tendinitis versus possible cervical nerve root strain.  At this time the  recommendation would be to obtain an MRI of her right shoulder and cervical spine to evaluate for any right cervical nerve root impingement versus myositis/tendinitis of her right rotator cuff.

## 2017-02-12 NOTE — ED Triage Notes (Signed)
Pt states she went out to dinner and stopped at a store on the way home   Pt states while at the store she started having pain in the right side of her neck that went up behind her ear and into her head  Pt states her right arm feels numb and tingly  Pt states once she got home she was having difficulty processing her thoughts into words  Pt has to pause in triage when she is talking to get her words together   Pt states at one point it felt like she was being stuck with needles in her right eye and that the left side of her face felt cold and the right side felt hot   Pt states her symptoms started between 9 and 10 pm

## 2017-02-12 NOTE — Progress Notes (Signed)
PROGRESS NOTE  ALYN DENONCOURT  [id number] DOB: [birth year]  DOA: 02/12/2017 PCP: [name], MD   Brief Narrative:  Madeline West is a 56 y.o. female with a past medical history significant for HTN, NIDDM, hyperlipidemia, obesity, smoking and TIA who presents with transient paresthesias and speech disturbance.  The patient was in her usual state of health until 2/14 evening around 9 PM she was coming home from dinner when she had onset of right-sided neck pain and headache as well as right upper arm numbness and difficulty finding words.  This lasted about 10 minutes or less and resolved.  It was similar in character to an episode she had a few years ago when she was admitted for [facility] for a week and finally diagnosed with a TIA. CT head showed an old lacunar infarct and no other acute findings. She initially presented to [facility] ED. Her case was discussed with Neuro Hospitalist on call who recommended obtaining CT head and neck to rule out dissection and then transferred to [facility] for TIA workup. She had an episode of bradycardia when an IV was being placed and sustained transient syncope which was likely vasovagal.   Assessment & Plan:   Principal Problem:   TIA (transient ischemic attack) Active Problems:   Essential hypertension, benign   Hyperlipidemia with target LDL less than 100   Type 2 diabetes mellitus with diabetic polyneuropathy, without long-term current use of insulin ([place])   1. Possible TIA: CTA head: Acute intracranial process. Old left basal ganglia lacunar infarct. Neurology was consulted and recommended obtaining CT head and neck to rule out dissection and transfer to [facility] for TIA workup. CTA head and neck: No acute findings. MRI brain: Negative for acute infarct. 2-D echo: LVEF 55-60 percent and grade 1 diastolic dysfunction. Carotid Dopplers: 1-39 percent stenosis involving right ICA and the left ICA. LDL 146. A1c pending. On aspirin 81 MG daily  prior to admission, now on aspirin 325 MG daily for secondary stroke prevention. Symptoms resolved. No focal neurological deficits. Therapies evaluation: Pending. As per neurology, she had transient pain of her right neck, right shoulder that radiated down to the lateral aspect of her right arm into the top of her hand which was in the background of working out more in the gym lately and even using weights more often than usual. They suspect less likely TIA but more likely rotator cuff tendinitis versus possible cervical nerve root strain. Obtaining MRI of the right shoulder and cervical spine. 2. Type II DM: Holding oral medications. Continue SSI. A1c pending. 3. Essential hypertension: Hypertensive on admission. Recently changed to telmisartan. Reportedly not taking this yet. Restart ARB. 4. Transient vasovagal syncope: This occurred while IV was being placed. 5. Hyperlipidemia: LDL 146, goal <70. Continue statin. 6. Mild thrombocytopenia: Chronic and stable.   DVT prophylaxis: Lovenox Code Status: Full Family Communication: Discussed with spouse at bedside Disposition Plan: DC home possibly 02/13/17   Consultants:   Neurology  Procedures:   None  Antimicrobials:   2-D echo 02/12/17: Study Conclusions  - Left ventricle: The cavity size was normal. Systolic function was   normal. The estimated ejection fraction was in the range of 55%   to 60%. Wall motion was normal; there were no regional wall   motion abnormalities. Doppler parameters are consistent with   abnormal left ventricular relaxation (grade 1 diastolic   dysfunction). - Right ventricle: The cavity size was mildly dilated. Wall   thickness was  normal. - Right atrium: The atrium was mildly dilated.   Carotid Dopplers 02/12/17: Summary:  - The vertebral arteries appear patent with antegrade flow. - Findings consistent with 1-39 percent stenosis involving the   right internal carotid artery and the left internal  carotid   artery.   Subjective: Denies complaints. No further weakness, numbness, tingling or pain. Denies dizziness or lightheadedness.  Objective:  Vitals:   02/12/17 0650 02/12/17 0836 02/12/17 1243 02/12/17 1452  BP: (!) 157/95 (!) 164/90 (!) 149/81 (!) 103/52  Pulse: 70 70 90 66  Resp: 18 18 18 16   Temp: 98.2 F (36.8 C) 97.5 F (36.4 C) 98.3 F (36.8 C) 98.3 F (36.8 C)  TempSrc: Oral Oral Oral Oral  SpO2: 98% 100% 98% 95%  Weight:      Height:       No intake or output data in the 24 hours ending 02/12/17 1627 Filed Weights   02/12/17 0047  Weight: 77.1 kg (170 lb)    Examination:  General exam: Pleasant middle-aged female lying comfortably supine in bed. Respiratory system: Clear to auscultation. Respiratory effort normal. Cardiovascular system: S1 & S2 heard, RRR. No JVD, murmurs, rubs, gallops or clicks. No pedal edema. Telemetry: Sinus rhythm. Gastrointestinal system: Abdomen is nondistended, soft and nontender. No organomegaly or masses felt. Normal bowel sounds heard. Central nervous system: Alert and oriented. No focal neurological deficits. Extremities: Symmetric 5 x 5 power. Skin: No rashes, lesions or ulcers Psychiatry: Judgement and insight appear normal. Mood & affect appropriate.     Data Reviewed: I have personally reviewed following labs and imaging studies  CBC:  Recent Labs Lab 02/12/17 0241 02/12/17 0250  WBC 7.3  --   NEUTROABS 4.0  --   HGB 14.5 14.3  HCT 42.7 42.0  MCV 91.8  --   PLT 149*  --    Basic Metabolic Panel:  Recent Labs Lab 02/12/17 0241 02/12/17 0250  NA 141 142  K 3.8 3.6  CL 105 102  CO2 29  --   GLUCOSE 142* 135*  BUN 18 18  CREATININE 0.92 1.00  CALCIUM 9.2  --    GFR: Estimated Creatinine Clearance: 68.2 mL/min (by C-G formula based on SCr of 1 mg/dL). Liver Function Tests:  Recent Labs Lab 02/12/17 0241  AST 30  ALT 21  ALKPHOS 60  BILITOT 0.6  PROT 7.3  ALBUMIN 4.1   No results for  input(s): LIPASE, AMYLASE in the last 168 hours. No results for input(s): AMMONIA in the last 168 hours. Coagulation Profile:  Recent Labs Lab 02/12/17 0241  INR 1.01   Cardiac Enzymes: No results for input(s): CKTOTAL, CKMB, CKMBINDEX, TROPONINI in the last 168 hours. BNP (last 3 results) No results for input(s): PROBNP in the last 8760 hours. HbA1C: No results for input(s): HGBA1C in the last 72 hours. CBG:  Recent Labs Lab 02/12/17 0115 02/12/17 0804 02/12/17 1239  GLUCAP 134* 144* 127*   Lipid Profile:  Recent Labs  02/12/17 0734  CHOL 224*  HDL 53  LDLCALC 146*  TRIG 124  CHOLHDL 4.2   Thyroid Function Tests: No results for input(s): TSH, T4TOTAL, FREET4, T3FREE, THYROIDAB in the last 72 hours. Anemia Panel: No results for input(s): VITAMINB12, FOLATE, FERRITIN, TIBC, IRON, RETICCTPCT in the last 72 hours.  Sepsis Labs:  Recent Labs Lab 02/12/17 0241  WBC 7.3    No results found for this or any previous visit (from the past 240 hour(s)).  Radiology Studies: Ct Angio Head W Or Wo Contrast  Result Date: 02/12/2017 CLINICAL DATA:  Frontal headache radiating to RIGHT eye and RIGHT shoulder. Slowed speech. History of hypertension, diabetes, TIA. EXAM: CT ANGIOGRAPHY HEAD AND NECK TECHNIQUE: Multidetector CT imaging of the head and neck was performed using the standard protocol during bolus administration of intravenous contrast. Multiplanar CT image reconstructions and MIPs were obtained to evaluate the vascular anatomy. Carotid stenosis measurements (when applicable) are obtained utilizing NASCET criteria, using the distal internal carotid diameter as the denominator. CONTRAST:  100 cc Isovue 370 COMPARISON:  CT HEAD February 12, 2017 at 0213 hours. CT angiogram head and neck report dated March 13, 2014 though images are not available for direct comparison. FINDINGS: CTA NECK AORTIC ARCH: Normal appearance of the thoracic arch, normal branch pattern. The  origins of the innominate, left Common carotid artery and subclavian artery are widely patent. RIGHT CAROTID SYSTEM: Common carotid artery is widely patent, coursing in a straight line fashion. Moderate intimal thickening and calcific atherosclerosis of the carotid bifurcation without hemodynamically significant stenosis by NASCET criteria. Normal appearance of the included internal carotid artery. LEFT CAROTID SYSTEM: Common carotid artery is widely patent, coursing in a straight line fashion. Moderate intimal thickening and calcific atherosclerosis of the carotid bifurcation without hemodynamically significant stenosis by NASCET criteria. Normal appearance of the included internal carotid artery. VERTEBRAL ARTERIES:Codominant vertebral arteries. Normal appearance of the vertebral arteries, which appear widely patent. SKELETON: No acute osseous process though bone windows have not been submitted. 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 CTA HEAD ANTERIOR CIRCULATION: Mild calcific atherosclerosis the carotid siphons. Normal appearance of the cervical internal carotid arteries, petrous, cavernous and supra clinoid internal carotid arteries. Widely patent anterior communicating artery. Patent anterior and middle cerebral arteries. Mild stenosis LEFT M2 origin. No large vessel occlusion, hemodynamically significant stenosis, dissection, luminal irregularity, contrast extravasation or aneurysm. POSTERIOR CIRCULATION: Normal appearance of the vertebral arteries, vertebrobasilar junction and basilar artery, as well as main branch vessels. Patent bilateral posterior cerebral arteries mild luminal irregularity compatible with atherosclerosis. No large vessel occlusion, hemodynamically significant stenosis, dissection, contrast extravasation or aneurysm. VENOUS SINUSES: Major  dural venous sinuses are patent though not tailored for evaluation on this angiographic examination. ANATOMIC VARIANTS: None. DELAYED PHASE: No abnormal intracranial enhancement. IMPRESSION: CTA NECK: Atherosclerosis without hemodynamically significant stenosis or acute vascular process. CTA HEAD:  No emergent large vessel occlusion or severe stenosis. Mild intracranial atherosclerosis. Electronically Signed   By: Elon Alas M.D.   On: 02/12/2017 06:28   Ct Head Wo Contrast  Result Date: 02/12/2017 CLINICAL DATA:  Frontal headache, RIGHT upper extremity numbness and slurred speech. RIGHT neck pain radiating to RIGHT eye and shoulder. History of hypertension, diabetes, hyperlipidemia and TIA. EXAM: CT HEAD WITHOUT CONTRAST TECHNIQUE: Contiguous axial images were obtained from the base of the skull through the vertex without intravenous contrast. COMPARISON:  None. FINDINGS: BRAIN: No intraparenchymal hemorrhage, mass effect nor midline shift. No acute large vascular territory infarcts. Old small LEFT basal ganglia lacunar infarct with mild ex vacuo dilatation subjacent ventricle, ventricles and sulci are otherwise normal for patient's No abnormal extra-axial fluid collections. Basal cisterns are patent. VASCULAR: Mild calcific atherosclerosis the carotid siphons. SKULL/SOFT TISSUES: No skull fracture. No significant soft tissue swelling. ORBITS/SINUSES: The included ocular globes and orbital contents are normal.The mastoid aircells and included paranasal sinuses are well-aerated.  OTHER: None. IMPRESSION: No acute intracranial process. Old LEFT basal ganglia lacunar infarct. Electronically Signed   By: Elon Alas M.D.   On: 02/12/2017 02:30   Ct Angio Neck W And/or Wo Contrast  Result Date: 02/12/2017 CLINICAL DATA:  Frontal headache radiating to RIGHT eye and RIGHT shoulder. Slowed speech. History of hypertension, diabetes, TIA. EXAM: CT ANGIOGRAPHY HEAD AND NECK TECHNIQUE: Multidetector CT  imaging of the head and neck was performed using the standard protocol during bolus administration of intravenous contrast. Multiplanar CT image reconstructions and MIPs were obtained to evaluate the vascular anatomy. Carotid stenosis measurements (when applicable) are obtained utilizing NASCET criteria, using the distal internal carotid diameter as the denominator. CONTRAST:  100 cc Isovue 370 COMPARISON:  CT HEAD February 12, 2017 at 0213 hours. CT angiogram head and neck report dated March 13, 2014 though images are not available for direct comparison. FINDINGS: CTA NECK AORTIC ARCH: Normal appearance of the thoracic arch, normal branch pattern. The origins of the innominate, left Common carotid artery and subclavian artery are widely patent. RIGHT CAROTID SYSTEM: Common carotid artery is widely patent, coursing in a straight line fashion. Moderate intimal thickening and calcific atherosclerosis of the carotid bifurcation without hemodynamically significant stenosis by NASCET criteria. Normal appearance of the included internal carotid artery. LEFT CAROTID SYSTEM: Common carotid artery is widely patent, coursing in a straight line fashion. Moderate intimal thickening and calcific atherosclerosis of the carotid bifurcation without hemodynamically significant stenosis by NASCET criteria. Normal appearance of the included internal carotid artery. VERTEBRAL ARTERIES:Codominant vertebral arteries. Normal appearance of the vertebral arteries, which appear widely patent. SKELETON: No acute osseous process though bone windows have not been submitted. 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 CTA HEAD ANTERIOR CIRCULATION: Mild calcific atherosclerosis the carotid siphons. Normal appearance of the cervical internal carotid arteries, petrous, cavernous and supra clinoid internal  carotid arteries. Widely patent anterior communicating artery. Patent anterior and middle cerebral arteries. Mild stenosis LEFT M2 origin. No large vessel occlusion, hemodynamically significant stenosis, dissection, luminal irregularity, contrast extravasation or aneurysm. POSTERIOR CIRCULATION: Normal appearance of the vertebral arteries, vertebrobasilar junction and basilar artery, as well as main branch vessels. Patent bilateral posterior cerebral arteries mild luminal irregularity compatible with atherosclerosis. No large vessel occlusion, hemodynamically significant stenosis, dissection, contrast extravasation or aneurysm. VENOUS SINUSES: Major dural venous sinuses are patent though not tailored for evaluation on this angiographic examination. ANATOMIC VARIANTS: None. DELAYED PHASE: No abnormal intracranial enhancement. IMPRESSION: CTA NECK: Atherosclerosis without hemodynamically significant stenosis or acute vascular process. CTA HEAD:  No emergent large vessel occlusion or severe stenosis. Mild intracranial atherosclerosis. Electronically Signed   By: Elon Alas M.D.   On: 02/12/2017 06:28   Mr Brain Wo Contrast  Result Date: 02/12/2017 CLINICAL DATA:  TIA EXAM: MRI HEAD WITHOUT CONTRAST TECHNIQUE: Multiplanar, multiecho pulse sequences of the brain and surrounding structures were obtained without intravenous contrast. COMPARISON:  CT head 02/12/2017 FINDINGS: Brain: Negative for acute infarct. Mild chronic microvascular ischemic change most notably in the left frontal periventricular white matter. Ventricle size and cerebral volume normal. Negative for hemorrhage or mass. No edema or shift of the midline structures. Pituitary normal in size Vascular: Normal arterial flow voids. Skull and upper cervical spine: Negative Sinuses/Orbits: Mild mucosal edema paranasal sinuses.  Normal orbit. Other: None IMPRESSION: Negative for acute infarct. Mild chronic ischemia left frontal white matter.  Electronically  Signed   By: Franchot Gallo M.D.   On: 02/12/2017 12:52        Scheduled Meds: . aspirin  300 mg Rectal Daily   Or  . aspirin  325 mg Oral Daily  . enoxaparin (LOVENOX) injection  40 mg Subcutaneous Q24H  . gabapentin  300 mg Oral BID  . insulin aspart  0-15 Units Subcutaneous TID WC  . insulin aspart  0-5 Units Subcutaneous QHS  . irbesartan  300 mg Oral Daily  . LORazepam      . rosuvastatin  20 mg Oral Daily   Continuous Infusions:   LOS: 0 days        [facility], MD [facility] Pager [phone]  If 7PM-7AM, please contact night-coverage [email or web address] Password [email or web address] 02/12/2017, 4:27 PM

## 2017-02-12 NOTE — H&P (Signed)
History and Physical  Patient Name: Madeline West     [id number]    DOB: 1961-03-20    DOA: 02/12/2017 PCP: [name], MD   Patient coming from: Home     Chief Complaint: Speech disturbance, paresthesias  HPI: Madeline West is a 56 y.o. female with a past medical history significant for HTN, NIDDM, hyperlipidemia, obesity, smoking and TIA who presents with transient paresthesias and speech disturbance.  The patient was in her usual state of health until this evening around 9 PM she was coming home from dinner when she had onset of right-sided neck pain and headache as well as right upper arm numbness and difficulty finding words.  This lasted about 10 minutes or less and resolved.  It was similar in character to an episode she had a few years ago when she was admitted for [facility] for a week and finally diagnosed with a TIA.  ED course: -Afebrile, heart rate 74, respirations and pulse is normal, blood pressure 162/91 -Na 141, K 3.8, Cr 0.9, WBC 7.3K, Hgb 14.5 -INR 1.0 -Troponin negative -CT of the head showed an old lacunar infarct, no other acute changes -ECG showed sinus rhythm with early repolarization pattern, no T wave changes -She was initially given compazine and benadryl for suspected complicated migraine -The case was discussed with Neurology who recommended CTA head and neck to rule out dissection and then [facility] -While in the ER, she did have an episode of bradycardia when an IV was being placed, she passed out, but recovered and was mentating well immediately after and returned to her normal self within 15 minutes    Review of systems:  Review of Systems  HENT: Positive for ear pain.   Eyes: Positive for pain.  Musculoskeletal: Positive for neck pain.  Neurological: Positive for tingling, sensory change (and word finding difficulties) and headaches. Negative for speech change, focal weakness, seizures and loss of consciousness.    All other systems reviewed and are negative.        Past Medical History:  Diagnosis Date  . Anemia   . Diabetes ([place])   . Hyperlipidemia   . Hypertension   . TIA (transient ischemic attack)     Past Surgical History:  Procedure Laterality Date  . CESAREAN SECTION     x 3  . ORIF ELBOW FRACTURE Left   . TUBAL LIGATION      Social History: Patient lives with her husband.  Patient walks unassisted. She is a Freight forwarder for a [employer].  She smokes.  She is from [place].    Allergies  Allergen Reactions  . Lyrica [Pregabalin] Swelling    Felt like tongue swelling at night  . Metformin And Related     diarrhea  . Sulfa Antibiotics   . Sulfacetamide Sodium     Family history: family history includes Alcohol abuse in her other; Cancer in her other; Diabetes in her other; Drug abuse in her other; Hyperlipidemia in her other; Hypertension in her mother.  Prior to Admission medications   Medication Sig Start Date End Date Taking? Authorizing Provider  aspirin EC 81 MG tablet Take 1 tablet (81 mg total) by mouth daily. 01/12/17  Yes Janith Lima, MD  cyanocobalamin (,VITAMIN B-12,) 1000 MCG/ML injection INJECT 1ML INTO THE MUSCLE EVERY 14 DAYS 12/29/16  Yes Janith Lima, MD  Dulaglutide (TRULICITY) [id number] SOPN Inject 1 Act into the skin once a week. 01/12/17  Yes Janith Lima, MD  gabapentin (NEURONTIN) 300 MG capsule Take 1 capsule (300 mg total) by mouth 2 (two) times daily. 01/12/17  Yes Janith Lima, MD  rosuvastatin (CRESTOR) 20 MG tablet Take 1 tablet (20 mg total) by mouth daily. 01/12/17  Yes Janith Lima, MD  telmisartan (MICARDIS) 80 MG tablet Take 1 tablet (80 mg total) by mouth daily. Patient not taking: Reported on 02/12/2017 01/12/17   Janith Lima, MD     Physical Exam: BP 121/81 (BP Location: Right Arm)   Pulse 63   Temp 98.7 F (37.1 C)   Resp 18   Ht 5\' 6"  (1.676 m)   Wt 77.1 kg (170 lb)   LMP 02/17/2013   SpO2 95%   BMI 27.44  kg/m  General appearance: Well-developed, adult female, sleeping from compaz/benadryl but rousable, in no acute distress.   Eyes: Anicteric, conjunctiva pink, lids and lashes normal. PERRL.    ENT: No nasal deformity, discharge, epistaxis.  Hearing normal. OP dry without lesions. Lymph: No cervical, supraclavicular or axillary lymphadenopathy. Skin: Warm and dry.  No jaundice.  No suspicious rashes or lesions. Cardiac: RRR, nl S1-S2, no murmurs appreciated.  Capillary refill is brisk.  JVP normal.  No LE edema.  Radial and DP pulses 2+ and symmetric.  No carotid bruits. Respiratory: Normal respiratory rate and rhythm.  CTAB without rales or wheezes. GI: Abdomen soft without rigidity.  No TTP. No ascites, distension, no hepatosplenomegaly.   MSK: No deformities or effusions. Neuro: Pupils are 4 mm and reactive to 3 mm. Extraocular movements are intact, without nystagmus. Cranial nerve 5 is within normal limits. Cranial nerve 7 is symmetrical. Cranial nerve 8 is within normal limits. Cranial nerves 9 and 10 reveal equal palate elevation. Cranial nerve 11 reveals sternocleidomastoid strong. Cranial nerve 12 is midline. I do not note a deficit in motor strength testing in the upper and lower extremities bilaterally with normal motor, tone and bulk. Romberg maneuver is negative for pathology. Finger-to-nose testing is within normal limits. Speech is fluent. Naming is grossly intact. Attention span and concentration are within normal limits.  Throughout the whole exam she is sedated and very sleepy, but able to complete all commands. Psych: The patient is oriented to time, place and person. Behavior appropriate.  Affect blunted.  Recall, recent and remote, as well as general fund of knowledge seem within normal limits. No evidence of aural or visual hallucinations or delusions.       Labs on Admission:  I have personally reviewed following labs and imaging studies: CBC:  Recent Labs Lab  02/12/17 0241 02/12/17 0250  WBC 7.3  --   NEUTROABS 4.0  --   HGB 14.5 14.3  HCT 42.7 42.0  MCV 91.8  --   PLT 149*  --    Basic Metabolic Panel:  Recent Labs Lab 02/12/17 0241 02/12/17 0250  NA 141 142  K 3.8 3.6  CL 105 102  CO2 29  --   GLUCOSE 142* 135*  BUN 18 18  CREATININE 0.92 1.00  CALCIUM 9.2  --    GFR: Estimated Creatinine Clearance: 68.2 mL/min (by C-G formula based on SCr of 1 mg/dL). Liver Function Tests:  Recent Labs Lab 02/12/17 0241  AST 30  ALT 21  ALKPHOS 60  BILITOT 0.6  PROT 7.3  ALBUMIN 4.1   No results for input(s): LIPASE, AMYLASE in the last 168 hours. No results for input(s): AMMONIA in the last 168 hours. Coagulation  Profile:  Recent Labs Lab 02/12/17 0241  INR 1.01   Cardiac Enzymes: No results for input(s): CKTOTAL, CKMB, CKMBINDEX, TROPONINI in the last 168 hours. BNP (last 3 results) No results for input(s): PROBNP in the last 8760 hours. HbA1C: No results for input(s): HGBA1C in the last 72 hours. CBG: No results for input(s): GLUCAP in the last 168 hours. Lipid Profile: No results for input(s): CHOL, HDL, LDLCALC, TRIG, CHOLHDL, LDLDIRECT in the last 72 hours. Thyroid Function Tests: No results for input(s): TSH, T4TOTAL, FREET4, T3FREE, THYROIDAB in the last 72 hours. Anemia Panel: No results for input(s): VITAMINB12, FOLATE, FERRITIN, TIBC, IRON, RETICCTPCT in the last 72 hours. Sepsis Labs: Invalid input(s): PROCALCITONIN, LACTICIDVEN No results found for this or any previous visit (from the past 240 hour(s)).    Radiological Exams on Admission: Personally reviewed CT head report: Ct Head Wo Contrast  Result Date: 02/12/2017 CLINICAL DATA:  Frontal headache, RIGHT upper extremity numbness and slurred speech. RIGHT neck pain radiating to RIGHT eye and shoulder. History of hypertension, diabetes, hyperlipidemia and TIA. EXAM: CT HEAD WITHOUT CONTRAST TECHNIQUE: Contiguous axial images were obtained from the  base of the skull through the vertex without intravenous contrast. COMPARISON:  None. FINDINGS: BRAIN: No intraparenchymal hemorrhage, mass effect nor midline shift. No acute large vascular territory infarcts. Old small LEFT basal ganglia lacunar infarct with mild ex vacuo dilatation subjacent ventricle, ventricles and sulci are otherwise normal for patient's No abnormal extra-axial fluid collections. Basal cisterns are patent. VASCULAR: Mild calcific atherosclerosis the carotid siphons. SKULL/SOFT TISSUES: No skull fracture. No significant soft tissue swelling. ORBITS/SINUSES: The included ocular globes and orbital contents are normal.The mastoid aircells and included paranasal sinuses are well-aerated. OTHER: None. IMPRESSION: No acute intracranial process. Old LEFT basal ganglia lacunar infarct. Electronically Signed   By: Elon Alas M.D.   On: 02/12/2017 02:30     EKG: Independently reviewed. Rate normal, concave up ST changes, early repolarization pattern.  Repeat ECG immediately after vagal episode shows junctional rhythm, rate in 40s.      Assessment/Plan  1. Acute TIA:  This is new.  MRI pending.  ABCD 2 -Admit to telemetry -Neuro checks, NIHSS per protocol -Start daily aspirin 325 mg -Check lipids, hemoglobin A1c -CT angiogram head and neck completed -Carotid doppler and echocardiogram ordered -PT/OT/SLP consultation -Consult to Neurology, appreciate recommendations   2. Non-insulin-dependent diabetes:  -Hold Trulicity, Invokana -SSI with meals  3. HTN:  Hypertensive at admission, recently changed to telmisartan, reported not taking this yet -Restart ARB  4. Vagal episode:  Transient  5. Hyperlipidemia:  Last direct LDL >200 -Continue statin       DVT prophylaxis: Lovenox  Code Status: FULL  Family Communication: Husband at bedside  Disposition Plan: Anticipate Stroke work up as above and consult to ancillary services.  Expect discharge within 1  day. Consults called: Neurology Admission status: Telemetry, OBS status  Core measures: -VTE prophylaxis ordered at time of admission -Aspirin ordered at admission -Atrial fibrillation: not present -tPA not given because of resolved symptoms -Dysphagia screen ordered in ER -Lipids ordered -PT eval ordered -Smoking cessation recommended    Medical decision making: Patient seen at 4:42 AM on 02/12/2017.  The patient was discussed with Dr. Dina Rich. What exists of the patient's chart was reviewed in depth and summarized above.  Clinical condition: stable.       Edwin Dada [facility] Pager [phone]

## 2017-02-12 NOTE — Progress Notes (Signed)
Verbal order from [place], [place] to discontinue NIH assessment order and that vital signs can be checked routine instead of Q2

## 2017-02-12 NOTE — ED Notes (Signed)
Patient transported to CT 

## 2017-02-12 NOTE — ED Notes (Signed)
Pt is aware urine is needed 

## 2017-02-12 NOTE — Progress Notes (Signed)
Patient arrived to floor via [facility], on stretcher. Patient is alert to person, and place. Very drowsy. Husband at bedside.  Bed alarm on.  Cardiac monitor and name band verified by two.  Safety Maintained

## 2017-02-12 NOTE — ED Notes (Signed)
Attempted to call report 5W - 34C nurse unable to take at this time will return call.

## 2017-02-13 ENCOUNTER — Telehealth: Payer: Self-pay

## 2017-02-13 DIAGNOSIS — G458 Other transient cerebral ischemic attacks and related syndromes: Secondary | ICD-10-CM | POA: Diagnosis not present

## 2017-02-13 DIAGNOSIS — E785 Hyperlipidemia, unspecified: Secondary | ICD-10-CM | POA: Diagnosis not present

## 2017-02-13 DIAGNOSIS — E1142 Type 2 diabetes mellitus with diabetic polyneuropathy: Secondary | ICD-10-CM | POA: Diagnosis not present

## 2017-02-13 LAB — GLUCOSE, CAPILLARY
GLUCOSE-CAPILLARY: 135 mg/dL — AB (ref 65–99)
GLUCOSE-CAPILLARY: 105 mg/dL — AB (ref 65–99)

## 2017-02-13 LAB — HEMOGLOBIN A1C
HEMOGLOBIN A1C: 6 % — AB (ref 4.8–5.6)
Mean Plasma Glucose: 126 mg/dL

## 2017-02-13 MED ORDER — CEFUROXIME AXETIL 500 MG PO TABS: 500.0000 mg | ORAL_TABLET | Freq: Two times a day (BID) | ORAL | 0 refills | Status: AC

## 2017-02-13 NOTE — Progress Notes (Signed)
Madeline West to be D/C'd Home per MD order.  Discussed with the patient and all questions fully answered.  VSS, Skin clean, dry and intact without evidence of skin break down, no evidence of skin tears noted. IV catheter discontinued intact. Site without signs and symptoms of complications. Dressing and pressure applied.  An After Visit Summary was printed and given to the patient. Patient received prescription.  D/c education completed with patient/family including follow up instructions, medication list, d/c activities limitations if indicated, with other d/c instructions as indicated by MD - patient able to verbalize understanding, all questions fully answered.   Patient instructed to return to ED, call 911, or call MD for any changes in condition.   Patient escorted via [place], and D/C home via private auto.  Luci Bank 02/13/2017 4:03 PM

## 2017-02-13 NOTE — Progress Notes (Signed)
Occupational Therapy Evaluation Patient Details Name: Madeline West MRN: [id number] DOB: 09/30/1961 Today's Date: 02/13/2017    History of Present Illness 56 y.o.femaleadmitted with possible TIA. CT head showed an old lacunar infarct and no other acute findings. PMH: HTN, NIDDM, hyperlipidemia, obesity, smoking and TIA.    Clinical Impression   Pt completing ADL at baseline. Pt educated on compensatory techniqeus to reduce pain with ADL given R shoulder pain. Educated pt on RTC  isometric strengthening exercises and AAROM to reduce risk of developing a frozen shoulder. Pt states that she has recently started working out and it has worsened her shoulder pain. Recommend that pt follow up with her MD regarding her R shoulder if needed.     Follow Up Recommendations  No OT follow up    Equipment Recommendations  None recommended by OT    Recommendations for Other Services       Precautions / Restrictions Precautions Precautions: None Restrictions Weight Bearing Restrictions: No      Mobility Bed Mobility Overal bed mobility: Independent                Transfers Overall transfer level: Independent Equipment used: None             General transfer comment: no instability    Balance Overall balance assessment: No apparent balance deficits (not formally assessed)                                          ADL Overall ADL's : At baseline                                       General ADL Comments: Educated on compensatory techniqeus to reduce shoulder pain with ADL. Pt has a forward posture with increased scapular protraction. Educated on posture and need to work of improved positioning of B Paramedic      Pertinent Vitals/Pain Pain Assessment: 0-10 Pain Score: 4  Faces Pain Scale: Hurts a little bit Pain Location: Rt shoulder/neck Pain Descriptors / Indicators:  Sore;Tingling Pain Intervention(s): Limited activity within patient's tolerance (encouraged ice for shoudler)     Hand Dominance Right   Extremity/Trunk Assessment Upper Extremity Assessment Upper Extremity Assessment: RUE deficits/detail RUE Deficits / Details: Painfree ROM below 60 degrees shoulder flexion. Pt demonstrates pain with the "empty can test" along with pain at the [facility] joint during resisted horizontal adduction. Pt also describes feeling of tingling on lateral aspect of upper arm RUE Sensation:  (tingling) RUE Coordination:  (WFL)   Lower Extremity Assessment Lower Extremity Assessment: Defer to PT evaluation   Cervical / Trunk Assessment Cervical / Trunk Assessment: Normal   Communication Communication Communication: No difficulties   Cognition Arousal/Alertness: Awake/alert Behavior During Therapy: WFL for tasks assessed/performed Overall Cognitive Status: Within Functional Limits for tasks assessed                     General Comments       Exercises Exercises: Other exercises Other Exercises Other Exercises: educated on AAROM in shoulder Flex, ER and Abd to reduce risk of developing a frozen shoulder Other Exercises: educated on scapular strengthening for depression and retraction Other Exercises: isometric ex within  pain free zone for shoulder scaption, FF and Abd and ER. Pt demosntrated understanding.  Other Exercises: Pt has been recently workin gout at the gym - educated to avoid overhead shoulder resistive work and "dips"Encouraged use of ice   Shoulder Instructions      Home Living Family/patient expects to be discharged to:: Private residence Living Arrangements: Spouse/significant other Available Help at Discharge: Family Type of Home: House Home Access: Stairs to enter Technical brewer of Steps: flight Entrance Stairs-Rails: Right                 Home Equipment: None      Lives With: Spouse (husband and children)     Prior Functioning/Environment Level of Independence: Independent        Comments: working        OT Problem List: Decreased range of motion;Impaired sensation;Obesity;Impaired UE functional use;Pain   OT Treatment/Interventions:      OT Goals(Current goals can be found in the care plan section) Acute Rehab OT Goals Patient Stated Goal: get home OT Goal Formulation: All assessment and education complete, DC therapy  OT Frequency:     Barriers to D/C:            Co-evaluation              End of Session    Activity Tolerance: Patient tolerated treatment well Patient left: in bed;with call bell/phone within reach;with family/visitor present   Time: [id number] OT Time Calculation (min): 18 min Charges:  OT General Charges $OT Visit: 1 Procedure OT Evaluation $OT Eval Low Complexity: 1 Procedure G-Codes: OT G-codes **NOT FOR INPATIENT CLASS** Functional Assessment Tool Used: clinical judgement Functional Limitation: Self care Self Care Current Status [id number]): At least 1 percent but less than 20 percent impaired, limited or restricted Self Care Goal Status [id number]): 0 percent impaired, limited or restricted Self Care Discharge Status [phone]): 0 percent impaired, limited or restricted  Shante Archambeault,HILLARY 02/13/2017, 4:06 PM   [facility], OT/L  [phone] 02/13/2017

## 2017-02-13 NOTE — Discharge Summary (Signed)
Physician Discharge Summary  Madeline West [id number] DOB: Sep 19, 1961 DOA: 02/12/2017  PCP: [name], MD  Admit date: 02/12/2017 Discharge date: 02/13/2017  Time spent: > 35  minutes  Recommendations for Outpatient Follow-up:  1.  encourage compliance with medication 2. Encourage tobacco cessation   Discharge Diagnoses:  Principal Problem:   TIA (transient ischemic attack) Active Problems:   Essential hypertension, benign   Hyperlipidemia with target LDL less than 100   Type 2 diabetes mellitus with diabetic polyneuropathy, without long-term current use of insulin ([place])   Discharge Condition: stable  Diet recommendation: heart healthy  Filed Weights   02/12/17 0047  Weight: 77.1 kg (170 lb)    History of present illness:  56 y.o. female with a past medical history significant for HTN, NIDDM, hyperlipidemia, obesity, smoking and TIA who presents with transient paresthesias and speech disturbance.  The patient was in her usual state of health until this evening around 9 PM she was coming home from dinner when she had onset of right-sided neck pain and headache as well as right upper arm numbness and difficulty finding words  Hospital Course:  TIA - Not felt to be stroke or TIA by neurology. An workup negative. Patient reported that she is not taking aspirin regularly. I have encouraged her to do so. Has not had any word finding difficulties while in house.  Right-sided arm discomfort and numbness - Not secondary to any stroke. Patient does have some central canal narrowing on C5- C6, please see MRI for reports. No weakness reported. - MRI of shoulder reporting tendinitis which I discussed with patient. She needs to rest her arm and NSAIDs if blood pressures permit. She is to discuss this further with her primary care physician  Procedures: Stroke workup  MRI of shoulder neck brain  Consultations:  Neurology  Discharge Exam: Vitals:   02/12/17 1452 02/13/17  0511  BP: (!) 103/52 140/87  Pulse: 66 68  Resp: 16 18  Temp: 98.3 F (36.8 C) 97.8 F (36.6 C)    General: Pt in nad, alert and awake Cardiovascular: s1 ans 2 wnl, no rubs Respiratory: CTA BL, no wheezes  Discharge Instructions   Discharge Instructions    Call MD for:  difficulty breathing, headache or visual disturbances    Complete by:  As directed    Call MD for:  temperature >100.4    Complete by:  As directed    Diet - low sodium heart healthy    Complete by:  As directed    Discharge instructions    Complete by:  As directed    Please be sure to follow up with your primary care physician in the next 1-2 weeks or sooner should any new concerns arise   Increase activity slowly    Complete by:  As directed      Current Discharge Medication List    START taking these medications   Details  cefUROXime (CEFTIN) 500 MG tablet Take 1 tablet (500 mg total) by mouth 2 (two) times daily with a meal. Qty: 4 tablet, Refills: 0      CONTINUE these medications which have NOT CHANGED   Details  aspirin EC 81 MG tablet Take 1 tablet (81 mg total) by mouth daily. Qty: 90 tablet, Refills: 3   Associated Diagnoses: Essential hypertension, benign; Type 2 diabetes mellitus without complication, without long-term current use of insulin ([place]); Hyperlipidemia with target LDL less than 100    cyanocobalamin (,VITAMIN B-12,) 1000 MCG/ML injection INJECT  1ML INTO THE MUSCLE EVERY 14 DAYS Qty: 10 mL, Refills: 1    Dulaglutide (TRULICITY) [id number] [id number] SOPN Inject 1 Act into the skin once a week. Qty: 4 pen, Refills: 3   Associated Diagnoses: Type 2 diabetes mellitus without complication, without long-term current use of insulin ([facility])    gabapentin (NEURONTIN) 300 MG capsule Take 1 capsule (300 mg total) by mouth 2 (two) times daily. Qty: 60 capsule, Refills: 5   Associated Diagnoses: Diabetic neuropathy, painful ([facility])    rosuvastatin (CRESTOR) 20 MG tablet Take 1 tablet (20 mg total)  by mouth daily. Qty: 90 tablet, Refills: 3   Associated Diagnoses: Hyperlipidemia with target LDL less than 100    telmisartan (MICARDIS) 80 MG tablet Take 1 tablet (80 mg total) by mouth daily. Qty: 90 tablet, Refills: 1   Associated Diagnoses: Essential hypertension, benign; Type 2 diabetes mellitus without complication, without long-term current use of insulin ([facility])       Allergies  Allergen Reactions  . Lyrica [Pregabalin] Swelling    Felt like tongue swelling at night  . Metformin And Related     diarrhea  . Sulfa Antibiotics   . Sulfacetamide Sodium       The results of significant diagnostics from this hospitalization (including imaging, microbiology, ancillary and laboratory) are listed below for reference.    Significant Diagnostic Studies: Ct Angio Head W Or Wo Contrast  Result Date: 02/12/2017 CLINICAL DATA:  Frontal headache radiating to RIGHT eye and RIGHT shoulder. Slowed speech. History of hypertension, diabetes, TIA. EXAM: CT ANGIOGRAPHY HEAD AND NECK TECHNIQUE: Multidetector CT imaging of the head and neck was performed using the standard protocol during bolus administration of intravenous contrast. Multiplanar CT image reconstructions and MIPs were obtained to evaluate the vascular anatomy. Carotid stenosis measurements (when applicable) are obtained utilizing NASCET criteria, using the distal internal carotid diameter as the denominator. CONTRAST:  100 cc Isovue 370 COMPARISON:  CT HEAD February 12, 2017 at 0213 hours. CT angiogram head and neck report dated March 13, 2014 though images are not available for direct comparison. FINDINGS: CTA NECK AORTIC ARCH: Normal appearance of the thoracic arch, normal branch pattern. The origins of the innominate, left Common carotid artery and subclavian artery are widely patent. RIGHT CAROTID SYSTEM: Common carotid artery is widely patent, coursing in a straight line fashion. Moderate intimal thickening and calcific atherosclerosis  of the carotid bifurcation without hemodynamically significant stenosis by NASCET criteria. Normal appearance of the included internal carotid artery. LEFT CAROTID SYSTEM: Common carotid artery is widely patent, coursing in a straight line fashion. Moderate intimal thickening and calcific atherosclerosis of the carotid bifurcation without hemodynamically significant stenosis by NASCET criteria. Normal appearance of the included internal carotid artery. VERTEBRAL ARTERIES:Codominant vertebral arteries. Normal appearance of the vertebral arteries, which appear widely patent. SKELETON: No acute osseous process though bone windows have not been submitted. 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 CTA HEAD ANTERIOR CIRCULATION: Mild calcific atherosclerosis the carotid siphons. Normal appearance of the cervical internal carotid arteries, petrous, cavernous and supra clinoid internal carotid arteries. Widely patent anterior communicating artery. Patent anterior and middle cerebral arteries. Mild stenosis LEFT M2 origin. No large vessel occlusion, hemodynamically significant stenosis, dissection, luminal irregularity, contrast extravasation or aneurysm. POSTERIOR CIRCULATION: Normal appearance of the vertebral arteries, vertebrobasilar junction and basilar artery, as well as main branch vessels. Patent bilateral posterior cerebral  arteries mild luminal irregularity compatible with atherosclerosis. No large vessel occlusion, hemodynamically significant stenosis, dissection, contrast extravasation or aneurysm. VENOUS SINUSES: Major dural venous sinuses are patent though not tailored for evaluation on this angiographic examination. ANATOMIC VARIANTS: None. DELAYED PHASE: No abnormal intracranial enhancement. IMPRESSION: CTA NECK: Atherosclerosis without hemodynamically significant  stenosis or acute vascular process. CTA HEAD:  No emergent large vessel occlusion or severe stenosis. Mild intracranial atherosclerosis. Electronically Signed   By: Elon Alas M.D.   On: 02/12/2017 06:28   Ct Head Wo Contrast  Result Date: 02/12/2017 CLINICAL DATA:  Frontal headache, RIGHT upper extremity numbness and slurred speech. RIGHT neck pain radiating to RIGHT eye and shoulder. History of hypertension, diabetes, hyperlipidemia and TIA. EXAM: CT HEAD WITHOUT CONTRAST TECHNIQUE: Contiguous axial images were obtained from the base of the skull through the vertex without intravenous contrast. COMPARISON:  None. FINDINGS: BRAIN: No intraparenchymal hemorrhage, mass effect nor midline shift. No acute large vascular territory infarcts. Old small LEFT basal ganglia lacunar infarct with mild ex vacuo dilatation subjacent ventricle, ventricles and sulci are otherwise normal for patient's No abnormal extra-axial fluid collections. Basal cisterns are patent. VASCULAR: Mild calcific atherosclerosis the carotid siphons. SKULL/SOFT TISSUES: No skull fracture. No significant soft tissue swelling. ORBITS/SINUSES: The included ocular globes and orbital contents are normal.The mastoid aircells and included paranasal sinuses are well-aerated. OTHER: None. IMPRESSION: No acute intracranial process. Old LEFT basal ganglia lacunar infarct. Electronically Signed   By: Elon Alas M.D.   On: 02/12/2017 02:30   Ct Angio Neck W And/or Wo Contrast  Result Date: 02/12/2017 CLINICAL DATA:  Frontal headache radiating to RIGHT eye and RIGHT shoulder. Slowed speech. History of hypertension, diabetes, TIA. EXAM: CT ANGIOGRAPHY HEAD AND NECK TECHNIQUE: Multidetector CT imaging of the head and neck was performed using the standard protocol during bolus administration of intravenous contrast. Multiplanar CT image reconstructions and MIPs were obtained to evaluate the vascular anatomy. Carotid stenosis measurements (when  applicable) are obtained utilizing NASCET criteria, using the distal internal carotid diameter as the denominator. CONTRAST:  100 cc Isovue 370 COMPARISON:  CT HEAD February 12, 2017 at 0213 hours. CT angiogram head and neck report dated March 13, 2014 though images are not available for direct comparison. FINDINGS: CTA NECK AORTIC ARCH: Normal appearance of the thoracic arch, normal branch pattern. The origins of the innominate, left Common carotid artery and subclavian artery are widely patent. RIGHT CAROTID SYSTEM: Common carotid artery is widely patent, coursing in a straight line fashion. Moderate intimal thickening and calcific atherosclerosis of the carotid bifurcation without hemodynamically significant stenosis by NASCET criteria. Normal appearance of the included internal carotid artery. LEFT CAROTID SYSTEM: Common carotid artery is widely patent, coursing in a straight line fashion. Moderate intimal thickening and calcific atherosclerosis of the carotid bifurcation without hemodynamically significant stenosis by NASCET criteria. Normal appearance of the included internal carotid artery. VERTEBRAL ARTERIES:Codominant vertebral arteries. Normal appearance of the vertebral arteries, which appear widely patent. SKELETON: No acute osseous process though bone windows have not been submitted. 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 CTA HEAD ANTERIOR CIRCULATION: Mild calcific atherosclerosis the carotid siphons. Normal appearance of the cervical internal carotid arteries, petrous, cavernous and supra clinoid internal carotid arteries. Widely patent anterior communicating artery. Patent anterior and middle cerebral arteries. Mild stenosis LEFT M2 origin. No large vessel occlusion, hemodynamically significant stenosis, dissection, luminal irregularity,  contrast  extravasation or aneurysm. POSTERIOR CIRCULATION: Normal appearance of the vertebral arteries, vertebrobasilar junction and basilar artery, as well as main branch vessels. Patent bilateral posterior cerebral arteries mild luminal irregularity compatible with atherosclerosis. No large vessel occlusion, hemodynamically significant stenosis, dissection, contrast extravasation or aneurysm. VENOUS SINUSES: Major dural venous sinuses are patent though not tailored for evaluation on this angiographic examination. ANATOMIC VARIANTS: None. DELAYED PHASE: No abnormal intracranial enhancement. IMPRESSION: CTA NECK: Atherosclerosis without hemodynamically significant stenosis or acute vascular process. CTA HEAD:  No emergent large vessel occlusion or severe stenosis. Mild intracranial atherosclerosis. Electronically Signed   By: Elon Alas M.D.   On: 02/12/2017 06:28   Mr Brain Wo Contrast  Result Date: 02/12/2017 CLINICAL DATA:  TIA EXAM: MRI HEAD WITHOUT CONTRAST TECHNIQUE: Multiplanar, multiecho pulse sequences of the brain and surrounding structures were obtained without intravenous contrast. COMPARISON:  CT head 02/12/2017 FINDINGS: Brain: Negative for acute infarct. Mild chronic microvascular ischemic change most notably in the left frontal periventricular white matter. Ventricle size and cerebral volume normal. Negative for hemorrhage or mass. No edema or shift of the midline structures. Pituitary normal in size Vascular: Normal arterial flow voids. Skull and upper cervical spine: Negative Sinuses/Orbits: Mild mucosal edema paranasal sinuses.  Normal orbit. Other: None IMPRESSION: Negative for acute infarct. Mild chronic ischemia left frontal white matter. Electronically Signed   By: Franchot Gallo M.D.   On: 02/12/2017 12:52   Mr Cervical Spine W Wo Contrast  Result Date: 02/12/2017 CLINICAL DATA:  Acute onset RIGHT neck pain, headache and RIGHT arm numbness for 10 minutes on February 11, 2017. Similar  symptoms a few years ago. Evaluate neck pain and arm paresthesias. History of hypertension, hyperlipidemia, diabetes. EXAM: MRI CERVICAL SPINE WITHOUT AND WITH CONTRAST TECHNIQUE: Multiplanar and multiecho pulse sequences of the cervical spine, to include the craniocervical junction and cervicothoracic junction, were obtained without and with intravenous contrast. CONTRAST:  52mL MULTIHANCE GADOBENATE DIMEGLUMINE 529 MG/ML IV SOLN COMPARISON:  CT angiogram head and neck February 12, 2017 at 0559 hours FINDINGS: Multiple sequences are moderately motion degraded, per technologist note, patient was sleeping and moving. ALIGNMENT: Maintained cervical lordosis.  No malalignment. VERTEBRAE/DISCS: Vertebral bodies are intact. Intervertebral disc morphology's and signal are normal. No abnormal bone marrow signal. No abnormal osseous or intradiscal enhancement. CORD:Cervical spinal cord is normal morphology and signal characteristics from the cervicomedullary junction to level of T1-2, the most caudal well visualized level. No abnormal cord, leptomeningeal or epidural enhancement. POSTERIOR FOSSA, VERTEBRAL ARTERIES, PARASPINAL TISSUES: No MR findings of ligamentous injury. Vertebral artery flow voids present. Included posterior fossa and paraspinal soft tissues are normal. DISC LEVELS: C2-3: No disc bulge. Mild facet arthropathy without canal stenosis or neural foraminal narrowing. C3-4: No disc bulge. Moderate RIGHT and mild LEFT facet arthropathy without canal stenosis or neural foraminal narrowing. C4-5: No disc bulge. Mild facet arthropathy without canal stenosis or neural foraminal narrowing. C5-6: Small broad-based disc bulge, uncovertebral hypertrophy and mild facet arthropathy. Moderate canal stenosis. Moderate to severe bilateral neural foraminal narrowing. C6-7: Small broad-based disc bulge. Uncovertebral hypertrophy and mild facet arthropathy without canal stenosis. Mild LEFT neural foraminal narrowing. C7-T1: No  disc bulge, canal stenosis nor neural foraminal narrowing. IMPRESSION: Motion degraded examination. 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 Neural foraminal narrowing C5-6 and C6-7: Moderate to severe bilaterally at C5-6. Electronically Signed   By: Elon Alas M.D.   On: 02/12/2017 22:20   Mr Shoulder Right Wo Contrast  Result Date: 02/12/2017  EXAM: MRI OF THE RIGHT SHOULDER WITHOUT CONTRAST TECHNIQUE: Multiplanar, multisequence MR imaging of the shoulder was performed. No intravenous contrast was administered. COMPARISON:  None. FINDINGS: Rotator cuff: Partial articular surface tear measuring 6 mm AP by 3 mm deep involving the anterior fibers of the supraspinous tendon near and its footplate. Associated tendinopathy of the supraspinous and infraspinatus tendons. Given trace subacromial and subdeltoid fluid, full-thickness microperforations are also suspected through the supraspinous tendon. Muscles:  No muscle atrophy. Biceps long head:  Intact. Acromioclavicular Joint: Mild-to-moderate arthropathy of the acromioclavicular joint with undersurface spurring touching upon the myotendinous junction of the supraspinous. Type III acromion. Trace fluid in the subacromial subdeltoid bursa. Glenohumeral Joint: No joint effusion. No chondral defect. Labrum: Grossly intact, but evaluation is limited by lack of intraarticular fluid. Bones: Subcortical degenerative cyst deep to the greater tuberosity at site of partial articular surface tear of the supraspinous. Findings likely represent changes of anterior impingement. Other: None. IMPRESSION: 1. Partial width, partial-thickness anterior fibroid tear of the footplate of the supraspinatus tendon. This in conjunction with a small amount of fluid in the subacromial and subdeltoid bursa is suspicious for full-thickness microperforations. 2. Supraspinatus and infraspinatus tendinopathy. 3. Subcortical cysts adjacent to the greater  tuberosity in keeping with anterior impingement. Type 3 acromial shape. 4. AC joint osteoarthritis with undersurface spurring touching upon the myotendinous junction of the supraspinous. Electronically Signed   By: Ashley Royalty M.D.   On: 02/12/2017 22:24    Microbiology: No results found for this or any previous visit (from the past 240 hour(s)).   Labs: Basic Metabolic Panel:  Recent Labs Lab 02/12/17 0241 02/12/17 0250  NA 141 142  K 3.8 3.6  CL 105 102  CO2 29  --   GLUCOSE 142* 135*  BUN 18 18  CREATININE 0.92 1.00  CALCIUM 9.2  --    Liver Function Tests:  Recent Labs Lab 02/12/17 0241  AST 30  ALT 21  ALKPHOS 60  BILITOT 0.6  PROT 7.3  ALBUMIN 4.1   No results for input(s): LIPASE, AMYLASE in the last 168 hours. No results for input(s): AMMONIA in the last 168 hours. CBC:  Recent Labs Lab 02/12/17 0241 02/12/17 0250  WBC 7.3  --   NEUTROABS 4.0  --   HGB 14.5 14.3  HCT 42.7 42.0  MCV 91.8  --   PLT 149*  --    Cardiac Enzymes: No results for input(s): CKTOTAL, CKMB, CKMBINDEX, TROPONINI in the last 168 hours. BNP: BNP (last 3 results) No results for input(s): BNP in the last 8760 hours.  ProBNP (last 3 results) No results for input(s): PROBNP in the last 8760 hours.  CBG:  Recent Labs Lab 02/12/17 1239 02/12/17 1716 02/12/17 2226 02/13/17 0743 02/13/17 1156  GLUCAP 127* 118* 114* 135* 105*   Signed:  Velvet Bathe MD.  [facility] 02/13/2017, 3:05 PM

## 2017-02-13 NOTE — Evaluation (Signed)
Physical Therapy Evaluation Patient Details Name: Madeline West MRN: [id number] DOB: 1961/02/04 Today's Date: 02/13/2017   History of Present Illness  56 y.o.femaleadmitted with possible TIA. CT head showed an old lacunar infarct and no other acute findings. PMH: HTN, NIDDM, hyperlipidemia, obesity, smoking and TIA.   Clinical Impression  Patient evaluated by Physical Therapy with no further acute PT needs identified. All education has been completed and the patient has no further questions. Pt up ambulating in room upon arrival. Pt able to ambulate 250 ft in halls and go up/down 12 steps. Pt with good stability, no loss of balance. Pt states that she essentially feels like herself at this point. PT is signing off. Thank you for this referral.     Follow Up Recommendations No PT follow up    Equipment Recommendations  None recommended by PT    Recommendations for Other Services       Precautions / Restrictions Precautions Precautions: None Restrictions Weight Bearing Restrictions: No      Mobility  Bed Mobility Overal bed mobility: Independent                Transfers Overall transfer level: Independent Equipment used: None             General transfer comment: no instability  Ambulation/Gait Ambulation/Gait assistance: Independent Ambulation Distance (Feet): 250 Feet Assistive device: None Gait Pattern/deviations: Step-through pattern   Gait velocity interpretation: at or above normal speed for age/gender General Gait Details: good stability, no loss of balance  Stairs Stairs: Yes Stairs assistance: Supervision Stair Management: One rail Right;Alternating pattern;Forwards Number of Stairs: 12 General stair comments: good stability, supervision for safety  Wheelchair Mobility    Modified Rankin (Stroke Patients Only) Modified Rankin (Stroke Patients Only) Pre-Morbid Rankin Score: No symptoms Modified Rankin: No symptoms     Balance Overall  balance assessment: No apparent balance deficits (not formally assessed)                                           Pertinent Vitals/Pain Pain Assessment: Faces Faces Pain Scale: Hurts a little bit Pain Location: Rt shoulder/neck Pain Descriptors / Indicators: Sore Pain Intervention(s): Monitored during session    Home Living Family/patient expects to be discharged to:: Private residence Living Arrangements: Spouse/significant other Available Help at Discharge: Family Type of Home: House Home Access: Stairs to enter Entrance Stairs-Rails: Right Entrance Stairs-Number of Steps: flight   Home Equipment: None      Prior Function Level of Independence: Independent         Comments: working     Journalist, newspaper   Dominant Hand: Right    Extremity/Trunk Assessment   Upper Extremity Assessment Upper Extremity Assessment: Overall WFL for tasks assessed RUE Deficits / Details: Pt reports shoulder pain on rt, no formal assessment performed. Discussed OPPT services if not improving with rest    Lower Extremity Assessment Lower Extremity Assessment: Overall WFL for tasks assessed    Cervical / Trunk Assessment Cervical / Trunk Assessment: Normal  Communication   Communication: No difficulties  Cognition Arousal/Alertness: Awake/alert Behavior During Therapy: WFL for tasks assessed/performed Overall Cognitive Status: Within Functional Limits for tasks assessed                      General Comments      Exercises     Assessment/Plan  PT Assessment Patent does not need any further PT services  PT Problem List            PT Treatment Interventions      PT Goals (Current goals can be found in the Care Plan section)  Acute Rehab PT Goals Patient Stated Goal: get home PT Goal Formulation: With patient Time For Goal Achievement: 02/13/17 Potential to Achieve Goals: Good    Frequency     Barriers to discharge         Co-evaluation               End of Session   Activity Tolerance: Patient tolerated treatment well Patient left: in bed;with call bell/phone within reach;with family/visitor present Nurse Communication: Mobility status         Time: [id number] PT Time Calculation (min) (ACUTE ONLY): 16 min   Charges:   PT Evaluation $PT Eval Moderate Complexity: 1 Procedure     PT G Codes:        Cassell Clement, PT, CSCS Pager [phone] Office [phone]  02/13/2017, 2:39 PM

## 2017-02-13 NOTE — Telephone Encounter (Signed)
Please advise patient is stating that her insurance is no longer covering her trulicity medication and she is needing a PA done for this.

## 2017-02-13 NOTE — Evaluation (Signed)
Speech Language Pathology Evaluation Patient Details Name: Madeline West MRN: [id number] DOB: 10-Oct-1961 Today's Date: 02/13/2017 Time: [id number] SLP Time Calculation (min) (ACUTE ONLY): 22 min  Problem List:  Patient Active Problem List   Diagnosis Date Noted  . Cervical cancer screening 01/12/2017  . Type 2 diabetes mellitus with diabetic polyneuropathy, without long-term current use of insulin ([place]) 03/06/2016  . Pure hyperglyceridemia 01/30/2016  . Memory loss 01/30/2016  . Screen for colon cancer 08/29/2015  . Diabetic neuropathy, painful ([place]) 08/10/2014  . Routine general medical examination at a health care facility 08/10/2014  . TIA (transient ischemic attack) 03/28/2014  . Hyperlipidemia with target LDL less than 100 03/27/2014  . Lumbar disc disease with radiculopathy 01/25/2013  . Folate deficiency anemia 06/12/2012  . Essential hypertension, benign 06/11/2012  . Visit for screening mammogram 06/11/2012  . Other vitamin B12 deficiency anemia 09/20/2008  . Iron deficiency anemia 09/06/2008  . TOBACCO ABUSE 09/06/2008   Past Medical History:  Past Medical History:  Diagnosis Date  . B12 deficiency anemia   . Hyperlipidemia   . Hypertension   . Iron deficiency anemia   . TIA (transient ischemic attack) 2015; ?02/12/2017  . Type II diabetes mellitus ([place])    Past Surgical History:  Past Surgical History:  Procedure Laterality Date  . CARDIAC CATHETERIZATION  1998  . [place]; 1993; 1995  . FRACTURE SURGERY    . ORIF ELBOW FRACTURE Right 1970s  . TUBAL LIGATION  1995   HPI:  Ptis a 56 y.o.femalewith PMH significant for HTN, NIDDM, hyperlipidemia, obesity, smoking and TIAwho presented with transient paresthesias and speech disturbance. Symptoms included right-sided neck pain, headache, right upper arm numbness, and difficulty finding words. CT of head showed no acute intracranial process, old LEFT basal ganglia lacunar infarct. MRI negative for  acute infarct. Mild chronic ischemia left frontal white matter.   Assessment / Plan / Recommendation Clinical Impression  Madeline West was alert and oriented x4. She reported intermittent word-finding difficulties since her previous TIA, which brought her into the hospital for this visit but this has since subsided. Madeline West lives with her husband and children and works full-time. Oral motor assessment was [facility]. The Montreal Cognitive Assessment (MoCA) was administered to assess the pt's language and cognitive abilties. She scored a total of 27/30 (normal score 26/30) with slight short-term memory/delayed recall deficits, which was functional. During the memory task, Madeline West was able to accurately recall 2 of the words independently and 3 words of the words with category cues.  Pt's performance on all other domains of the MoCA were WNL. Speech therapy treatment not necessary at this time; pt and husband in agreement and stated they did not have any other concerns-ST signing off.    SLP Assessment  Patient does not need any further Speech Lanaguage Pathology Services    Follow Up Recommendations  None    Frequency and Duration           SLP Evaluation Cognition  Overall Cognitive Status: Within Functional Limits for tasks assessed Arousal/Alertness: Awake/alert Orientation Level: Oriented X4 Attention: Sustained Sustained Attention: Appears intact Memory: Impaired Memory Impairment: Retrieval deficit;Decreased short term memory Decreased Short Term Memory: Verbal basic Awareness: Appears intact Problem Solving: Appears intact Safety/Judgment: Appears intact       Comprehension  Auditory Comprehension Overall Auditory Comprehension: Appears within functional limits for tasks assessed Yes/No Questions: Not tested Commands: Not tested Conversation: Complex Visual Recognition/Discrimination Discrimination: Not tested Reading Comprehension Reading Status:  Not tested    Expression  Expression Primary Mode of Expression: Verbal Verbal Expression Overall Verbal Expression: Appears within functional limits for tasks assessed Initiation: No impairment Level of Generative/Spontaneous Verbalization: Conversation Repetition: No impairment Naming: No impairment Pragmatics: No impairment Written Expression Dominant Hand: Right Written Expression: Not tested   Oral / Motor  Oral Motor/Sensory Function Overall Oral Motor/Sensory Function: Within functional limits Motor Speech Overall Motor Speech: Appears within functional limits for tasks assessed   GO                    Fransisca Kaufmann , Student-SLP 02/13/2017, 3:17 PM

## 2017-02-15 ENCOUNTER — Encounter: Payer: Self-pay | Admitting: Internal Medicine

## 2017-02-15 LAB — URINE CULTURE: Culture: >=100000 — AB

## 2017-02-16 ENCOUNTER — Other Ambulatory Visit: Payer: Self-pay | Admitting: Internal Medicine

## 2017-02-16 DIAGNOSIS — E1142 Type 2 diabetes mellitus with diabetic polyneuropathy: Secondary | ICD-10-CM

## 2017-02-16 NOTE — Progress Notes (Signed)
SLP addendum    02/16/17 1500  SLP G-Codes **NOT FOR INPATIENT CLASS**  Functional Assessment Tool Used skilled clinical judgement  Functional Limitations Spoken language expressive  Spoken Language Expression Current Status [phone]) [place]  Spoken Language Expression Goal Status [phone]) [facility]  Spoken Language Expression Discharge Status [phone]) [place]  Orbie Pyo [place] [phone]

## 2017-02-17 NOTE — Progress Notes (Signed)
Late entry for missing G-code.   02-18-17 1440  PT G-Codes **NOT FOR INPATIENT CLASS**  Functional Assessment Tool Used Clinical judgement  Functional Limitation Mobility: Walking and moving around  Mobility: Walking and Moving Around Current Status [id number]) CI  Mobility: Walking and Moving Around Goal Status [phone]) CI  Mobility: Walking and Moving Around Discharge Status [phone]) CI  Cassell Clement, PT, CSCS Pager [phone] Office [phone]

## 2017-02-18 NOTE — Telephone Encounter (Signed)
There is a pt email between PCP and pt regarding same. Will follow up intermittenly.

## 2017-02-19 ENCOUNTER — Other Ambulatory Visit: Payer: Self-pay | Admitting: Internal Medicine

## 2017-02-19 DIAGNOSIS — E1142 Type 2 diabetes mellitus with diabetic polyneuropathy: Secondary | ICD-10-CM

## 2017-02-19 MED ORDER — DULAGLUTIDE 1.5 MG/0.5ML ~~LOC~~ SOAJ: 1.0000 | SUBCUTANEOUS | 1 refills | Status: AC

## 2017-02-25 NOTE — Telephone Encounter (Signed)
Left detailed message that PA was approved and to contact pharmacy when she is ready to pick up.

## 2017-02-27 NOTE — Telephone Encounter (Signed)
PA for Trulicity was approved 

## 2017-06-04 ENCOUNTER — Other Ambulatory Visit: Payer: Self-pay | Admitting: Internal Medicine

## 2017-06-04 DIAGNOSIS — E1140 Type 2 diabetes mellitus with diabetic neuropathy, unspecified: Secondary | ICD-10-CM

## 2017-06-10 NOTE — Telephone Encounter (Signed)
Order cancelled due to inactivity

## 2017-10-26 IMAGING — CT CT HEAD W/O CM
3 of 4 series · 16 of 47 positions shown, 19 images · non-contrast
Comparison: None.

CLINICAL DATA: Frontal headache, RIGHT upper extremity numbness and
slurred speech. RIGHT neck pain radiating to RIGHT eye and shoulder.
History of hypertension, diabetes, hyperlipidemia and TIA.

EXAM:
CT HEAD WITHOUT CONTRAST
TECHNIQUE: Contiguous axial images were obtained from the base of the skull
through the vertex without intravenous contrast.

[Series 2: head w/o · axial · non-contrast · 0.45mm/px · z∈[+880,+1000]mm · 10 of 30 slices shown, 13 images]
[im 3/30  brain]
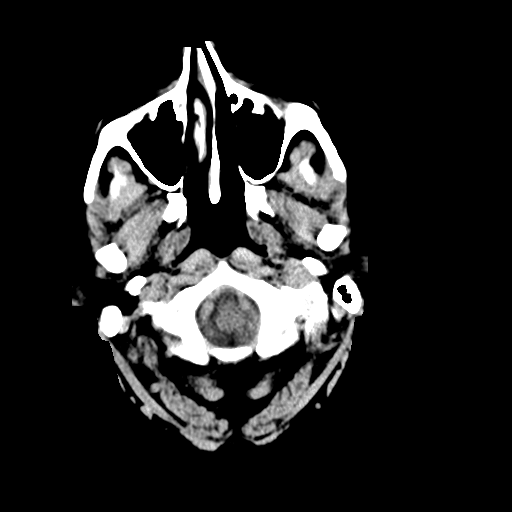
[im 3/30  bone]
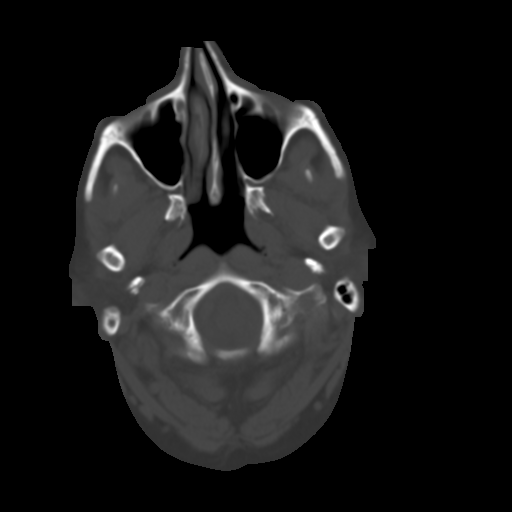
[im 5/30  brain]
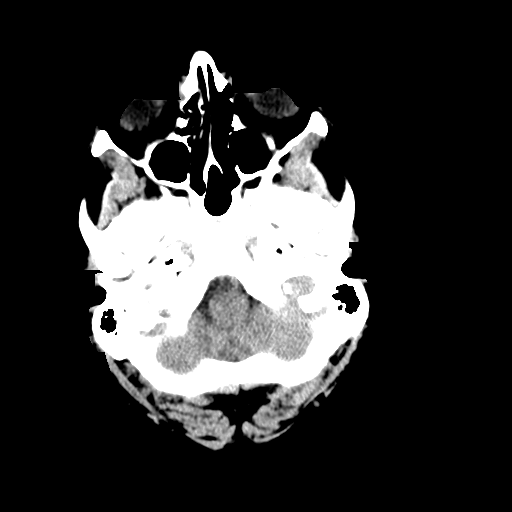
[im 9/30  brain]
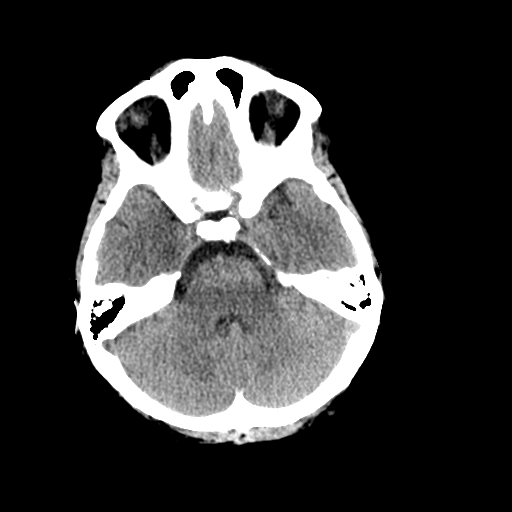
[im 11/30  brain]
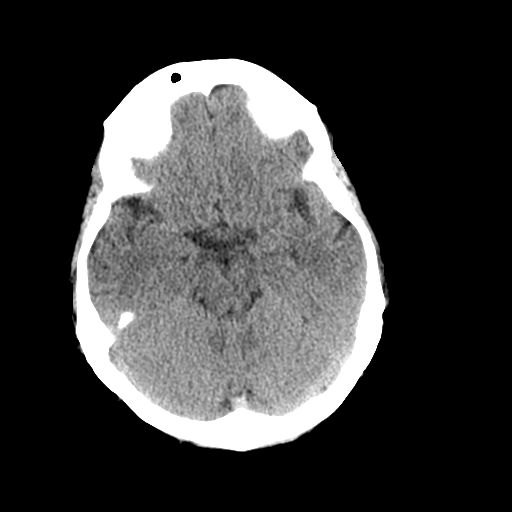
[im 13/30  brain]
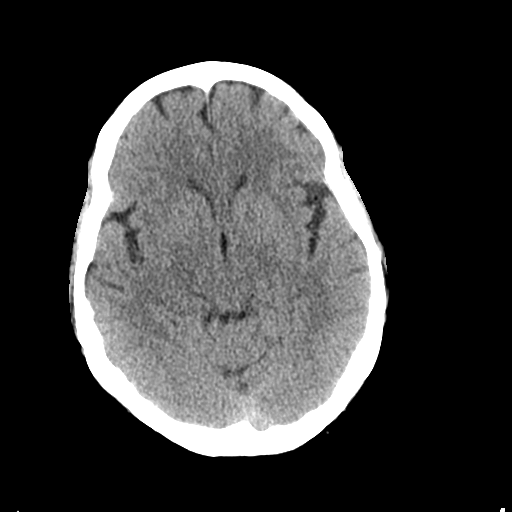
[im 13/30  bone]
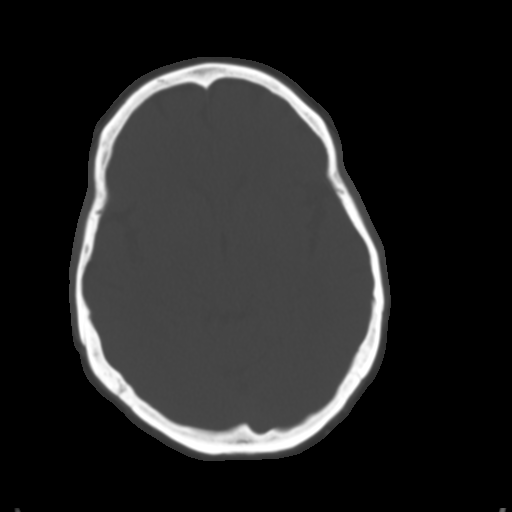
[im 17/30  brain]
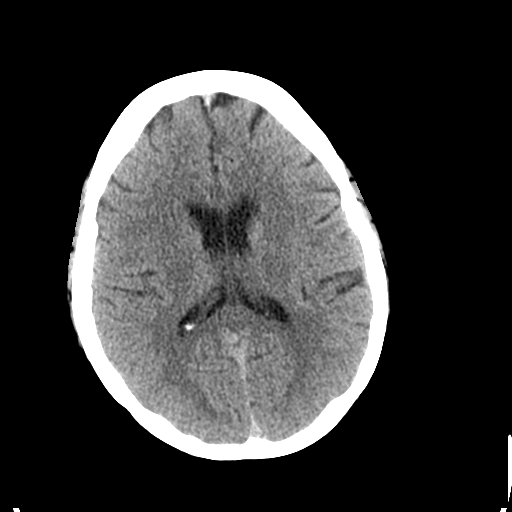
[im 19/30  brain]
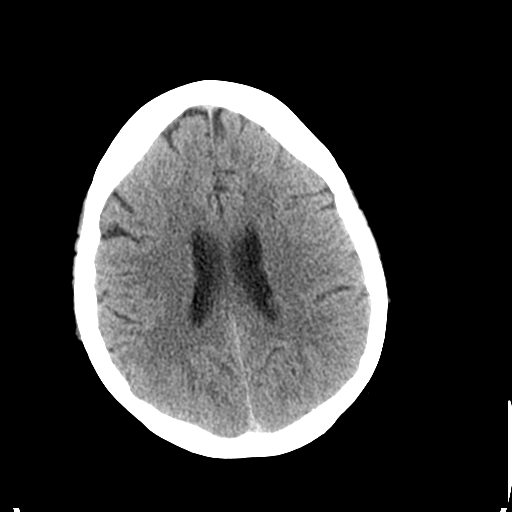
[im 21/30  brain]
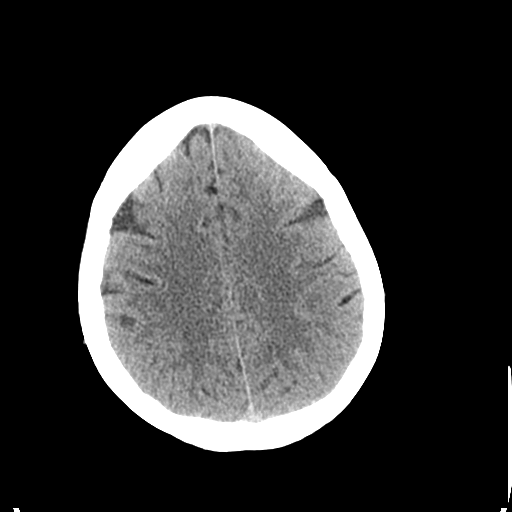
[im 25/30  brain]
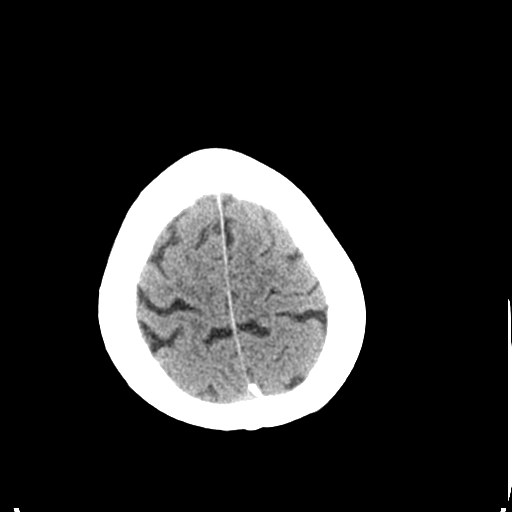
[im 25/30  bone]
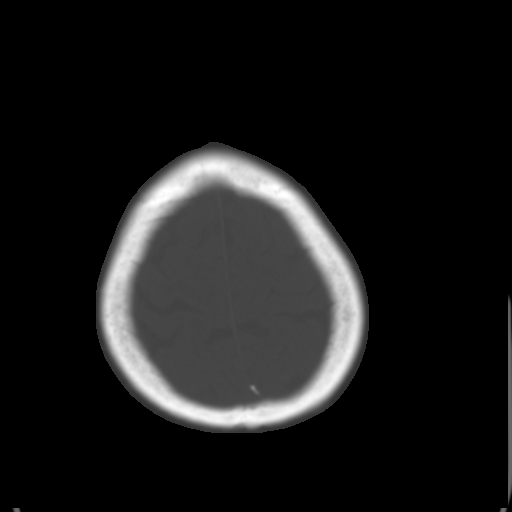
[im 27/30  brain]
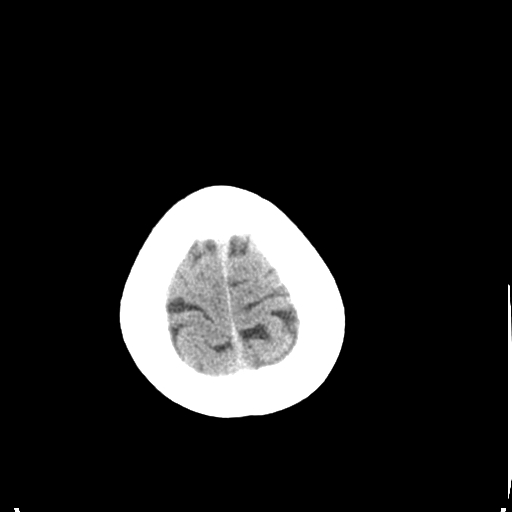

[Series 4: coronal · coronal · 0.29mm/px · 3 of 60 slices shown]
[im 20/60  brain]
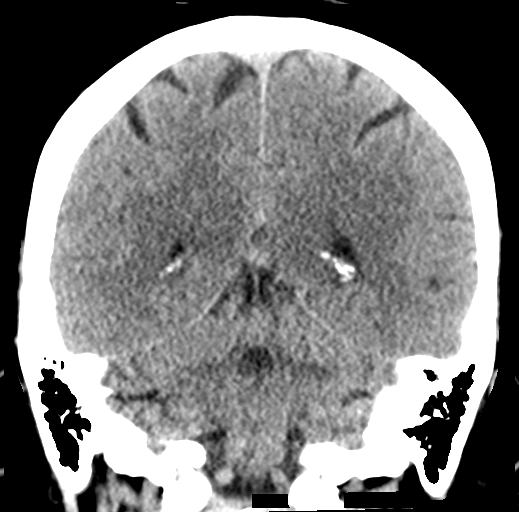
[im 27/60  brain]
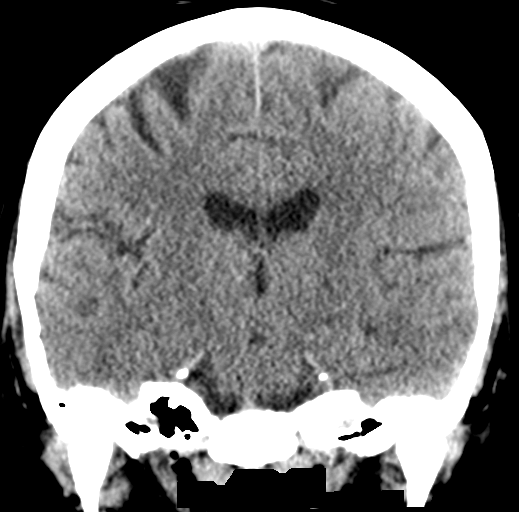
[im 33/60  brain]
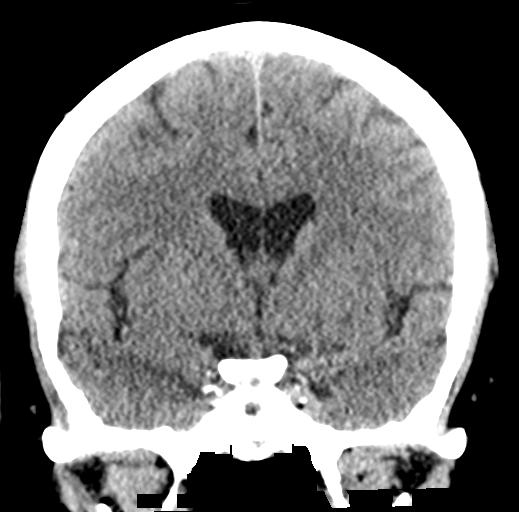

[Series 5: sagittal · sagittal · 0.29mm/px · 3 of 48 slices shown]
[im 16/48  brain]
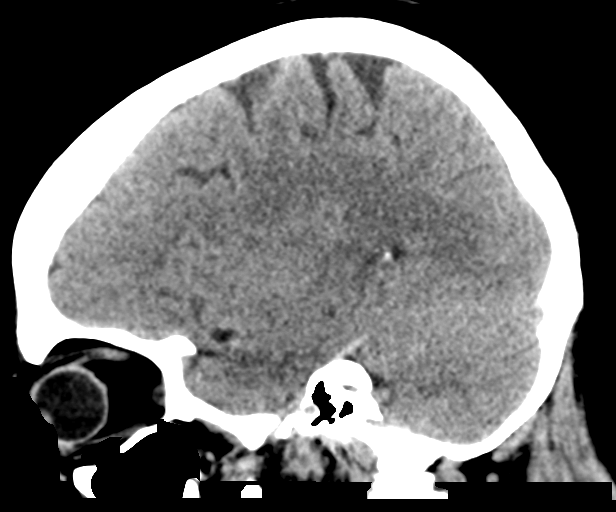
[im 24/48  brain]
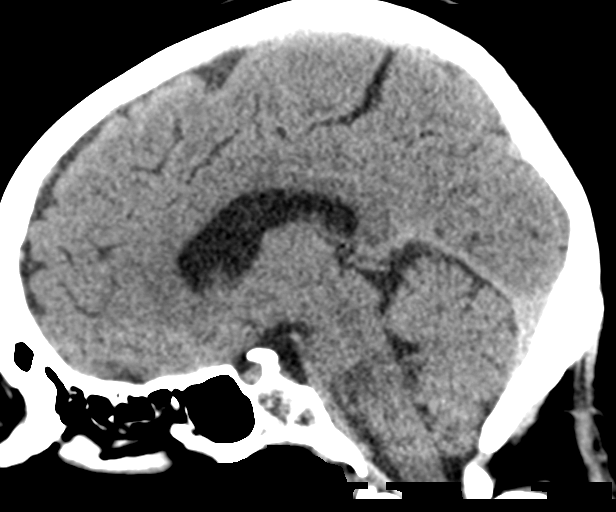
[im 32/48  brain]
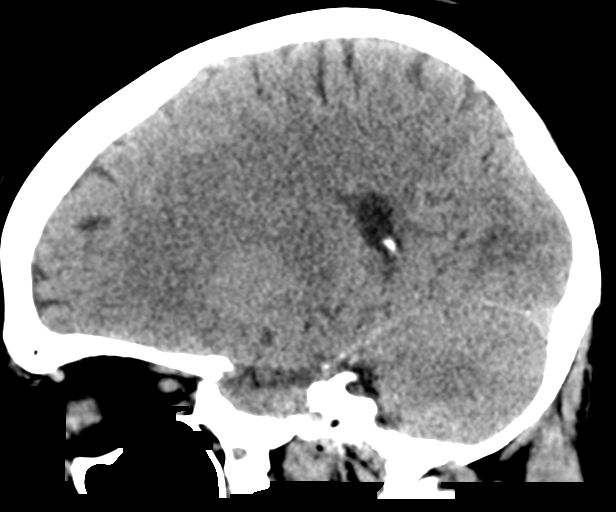

[16 of 47 positions shown; findings below may reference images not displayed]

FINDINGS: BRAIN: No intraparenchymal hemorrhage, mass effect nor midline
shift. No acute large vascular territory infarcts. Old small LEFT
basal ganglia lacunar infarct with mild ex vacuo dilatation
subjacent ventricle, ventricles and sulci are otherwise normal for
patient's No abnormal extra-axial fluid collections. Basal cisterns
are patent.

VASCULAR: Mild calcific atherosclerosis the carotid siphons.

SKULL/SOFT TISSUES: No skull fracture. No significant soft tissue
swelling.

ORBITS/SINUSES: The included ocular globes and orbital contents are
normal.The mastoid aircells and included paranasal sinuses are
well-aerated.

OTHER: None.
IMPRESSION: No acute intracranial process.

Old LEFT basal ganglia lacunar infarct.

## 2017-10-26 IMAGING — MR MR SHOULDER*R* W/O CM
7 series · 35 of 40 positions shown · non-contrast
Comparison: None.

EXAM:
MRI OF THE RIGHT SHOULDER WITHOUT CONTRAST
TECHNIQUE: Multiplanar, multisequence MR imaging of the shoulder was performed.
No intravenous contrast was administered.

[Series 3: T2 fat-sat · axial · 3.0mm · 0.23mm/px · z∈[-43,+30]mm · 7 of 22 slices shown (1 of 5)]
[im 1/22]
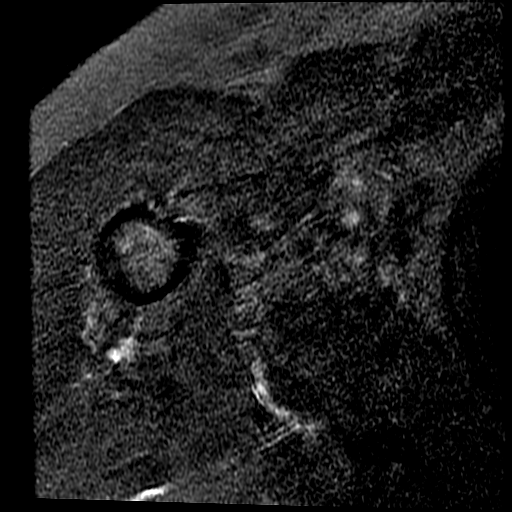
[im 4/22]
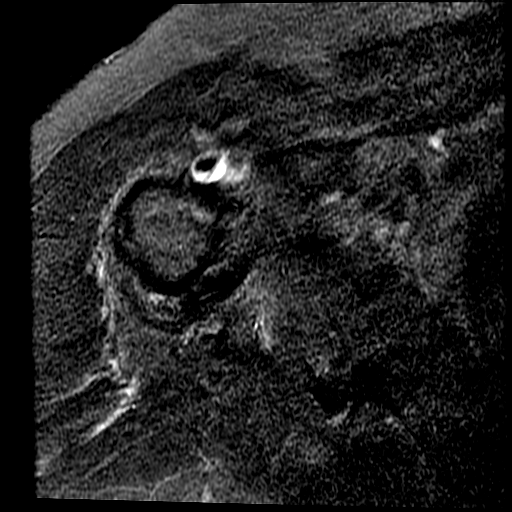
[im 8/22]
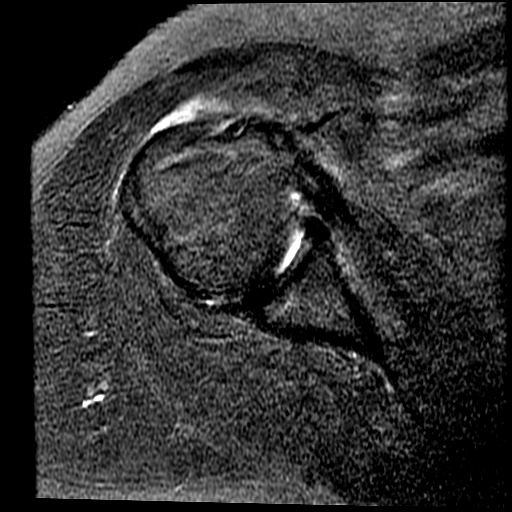
[im 11/22]
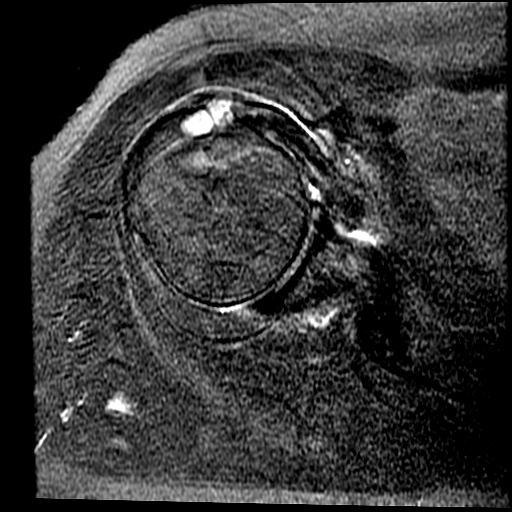
[im 15/22]
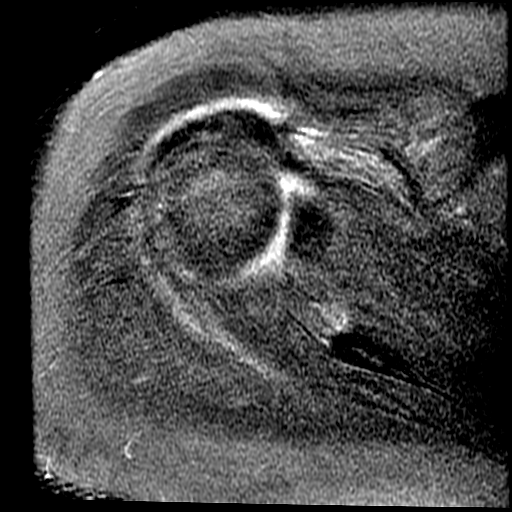
[im 18/22]
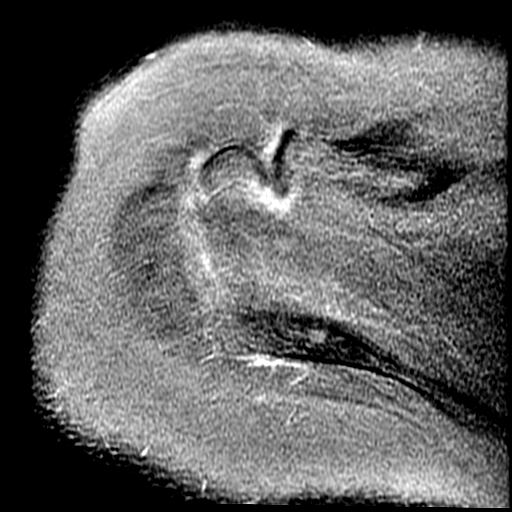
[im 22/22]
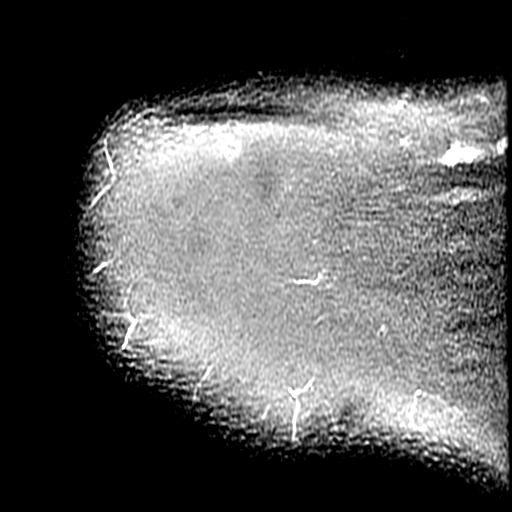

[Series 4: T2 fat-sat · oblique · 3.0mm · 0.27mm/px · 5 of 20 slices shown (2 of 5)]
[im 1/20]
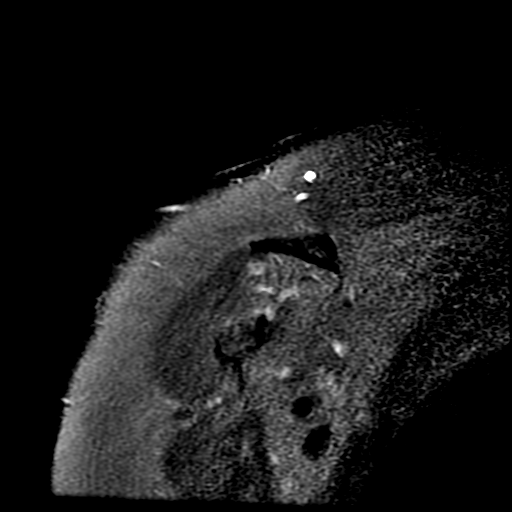
[im 5/20]
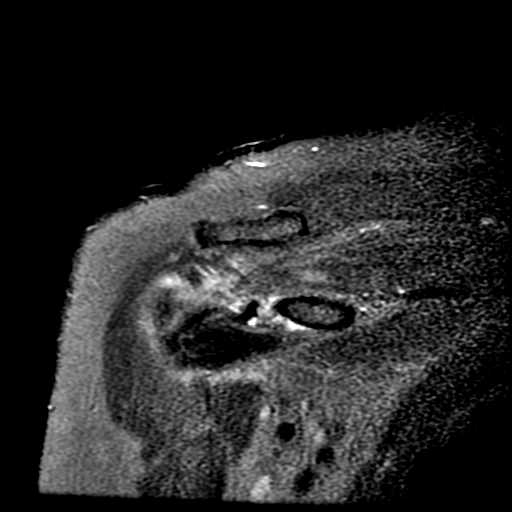
[im 10/20]
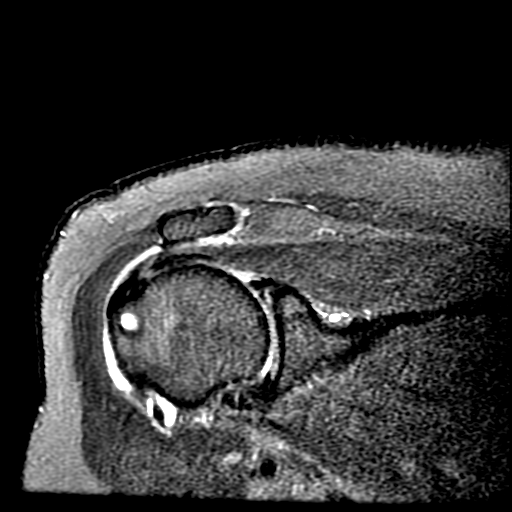
[im 15/20]
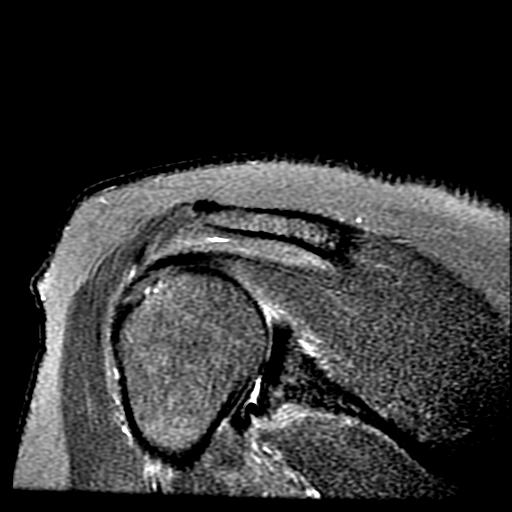
[im 20/20]
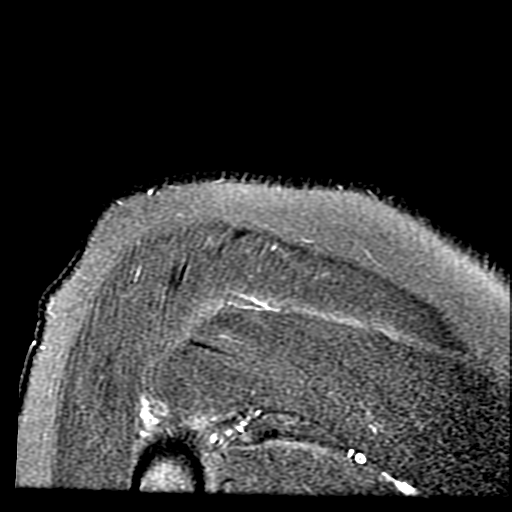

[Series 5: PD · oblique · 3.0mm · 0.55mm/px · 5 of 20 slices shown]
[im 1/20]
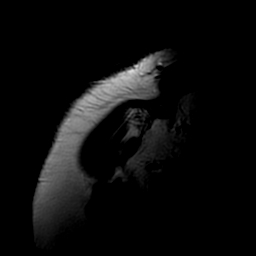
[im 5/20]
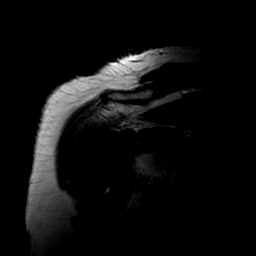
[im 10/20]
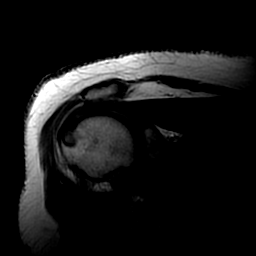
[im 15/20]
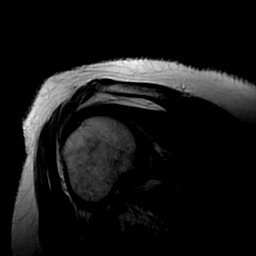
[im 20/20]
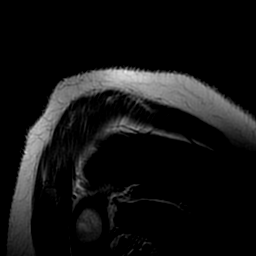

[Series 6: T1 · sagittal · 3.0mm · 0.55mm/px · 1 of 22 slices shown]
[im 1/22]
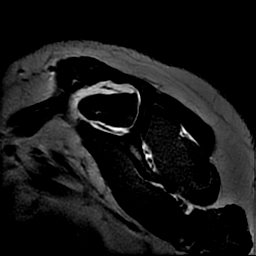

[Series 7: T2 fat-sat · sagittal · 3.0mm · 0.55mm/px · 6 of 22 slices shown (3 of 5)]
[im 1/22]
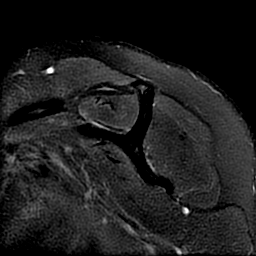
[im 5/22]
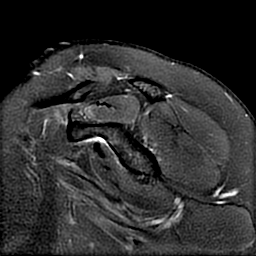
[im 9/22]
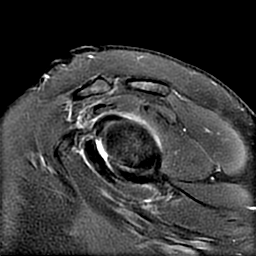
[im 13/22]
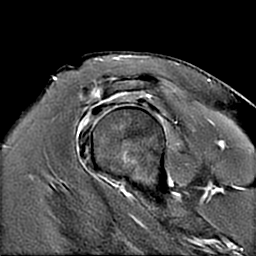
[im 17/22]
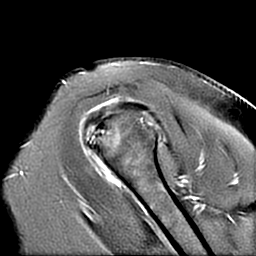
[im 22/22]
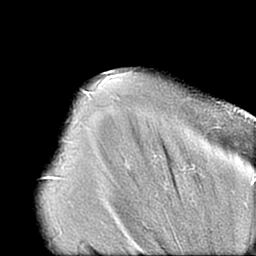

[Series 8: T2 fat-sat · axial · 3.0mm · 0.47mm/px · z∈[-43,+30]mm · 6 of 22 slices shown (4 of 5)]
[im 1/22]
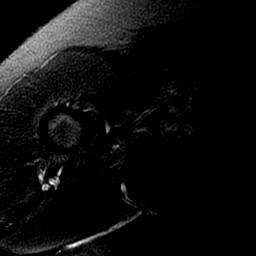
[im 5/22]
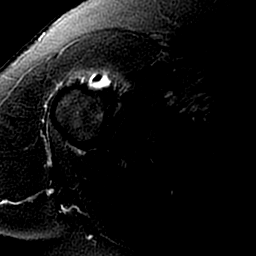
[im 9/22]
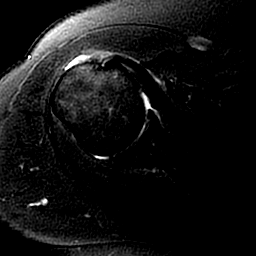
[im 13/22]
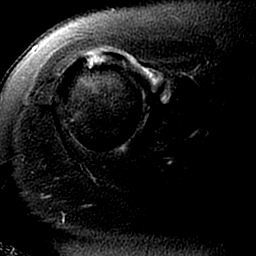
[im 17/22]
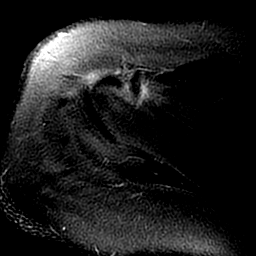
[im 22/22]
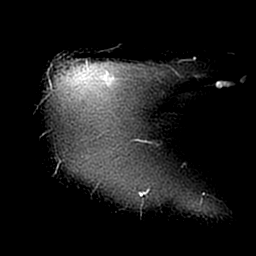

[Series 9: T2 fat-sat · oblique · 3.0mm · 0.55mm/px · 5 of 20 slices shown (5 of 5)]
[im 1/20]
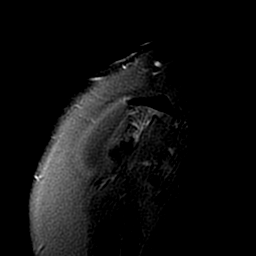
[im 5/20]
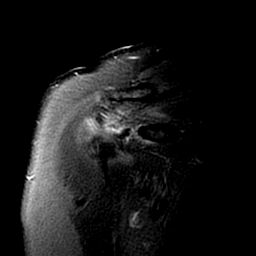
[im 10/20]
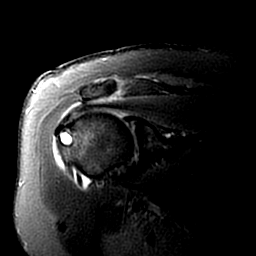
[im 15/20]
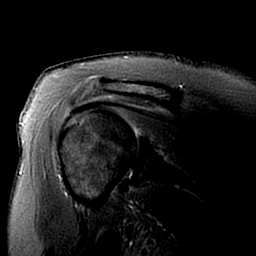
[im 20/20]
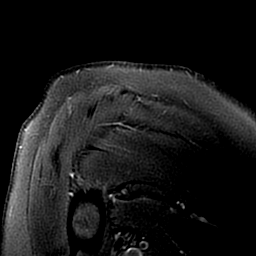

[35 of 40 positions shown; findings below may reference images not displayed]

FINDINGS: Rotator cuff: Partial articular surface tear measuring 6 mm AP by 3
mm deep involving the anterior fibers of the supraspinous tendon
near and its footplate. Associated tendinopathy of the supraspinous
and infraspinatus tendons. Given trace subacromial and subdeltoid
fluid, full-thickness microperforations are also suspected through
the supraspinous tendon.

Muscles:  No muscle atrophy.

Biceps long head:  Intact.

Acromioclavicular Joint: Mild-to-moderate arthropathy of the
acromioclavicular joint with undersurface spurring touching upon the
myotendinous junction of the supraspinous. Type III acromion. Trace
fluid in the subacromial subdeltoid bursa.

Glenohumeral Joint: No joint effusion. No chondral defect.

Labrum: Grossly intact, but evaluation is limited by lack of
intraarticular fluid.

Bones: Subcortical degenerative cyst deep to the greater tuberosity
at site of partial articular surface tear of the supraspinous.
Findings likely represent changes of anterior impingement.

Other: None.
IMPRESSION: 1. Partial width, partial-thickness anterior fibroid tear of the
footplate of the supraspinatus tendon. This in conjunction with a
small amount of fluid in the subacromial and subdeltoid bursa is
suspicious for full-thickness microperforations.
2. Supraspinatus and infraspinatus tendinopathy.
3. Subcortical cysts adjacent to the greater tuberosity in keeping
with anterior impingement. Type 3 acromial shape.
4. AC joint osteoarthritis with undersurface spurring touching upon
the myotendinous junction of the supraspinous.

## 2017-11-05 ENCOUNTER — Encounter: Payer: Self-pay | Admitting: Internal Medicine

## 2017-11-09 ENCOUNTER — Other Ambulatory Visit: Payer: Self-pay | Admitting: Internal Medicine

## 2017-12-01 ENCOUNTER — Encounter: Payer: Self-pay | Admitting: Internal Medicine

## 2017-12-02 ENCOUNTER — Other Ambulatory Visit: Payer: Self-pay | Admitting: Internal Medicine

## 2017-12-05 ENCOUNTER — Other Ambulatory Visit: Payer: Self-pay | Admitting: Internal Medicine

## 2018-02-18 ENCOUNTER — Telehealth: Payer: Self-pay | Admitting: *Deleted

## 2018-02-18 NOTE — Telephone Encounter (Signed)
PA for Trulicity initiated and sent via [employer]eds.

## 2018-02-19 NOTE — Telephone Encounter (Signed)
Pt has not been seen a year. We do not have current insurance information to do the PA for trulicity.  Can we get her in for an appt soon?

## 2018-02-23 NOTE — Telephone Encounter (Signed)
[facility] called to check status of the PA, they said they will put it on hold due to the fact they have been informed that pt has been contacted and is awaiting to be seen for an appt. Contact [employer] when pt comes in and has updated insurance info

## 2018-02-23 NOTE — Telephone Encounter (Signed)
LVM for patient to call back and make appt.  Make appt based off of what insuracne is, call office for any conflicts
